# Patient Record
Sex: Female | Born: 1970
Health system: Southern US, Community
[De-identification: ages and names within clinical notes are randomized; demographics above are authoritative.]

## PROBLEM LIST (undated history)

## (undated) DIAGNOSIS — D649 Anemia, unspecified: Secondary | ICD-10-CM

## (undated) DIAGNOSIS — R112 Nausea with vomiting, unspecified: Secondary | ICD-10-CM

## (undated) DIAGNOSIS — K219 Gastro-esophageal reflux disease without esophagitis: Secondary | ICD-10-CM

## (undated) DIAGNOSIS — I10 Essential (primary) hypertension: Secondary | ICD-10-CM

## (undated) DIAGNOSIS — R51 Headache: Secondary | ICD-10-CM

## (undated) DIAGNOSIS — Z9889 Other specified postprocedural states: Secondary | ICD-10-CM

## (undated) DIAGNOSIS — E119 Type 2 diabetes mellitus without complications: Secondary | ICD-10-CM

## (undated) HISTORY — PX: TUBAL LIGATION: SHX77

## (undated) HISTORY — DX: Type 2 diabetes mellitus without complications: E11.9

## (undated) HISTORY — PX: WISDOM TOOTH EXTRACTION: SHX21

---

## 1998-05-16 ENCOUNTER — Inpatient Hospital Stay (HOSPITAL_COMMUNITY): Admission: AD | Admit: 1998-05-16 | Discharge: 1998-05-16 | Payer: Self-pay | Admitting: Obstetrics and Gynecology

## 1998-05-17 ENCOUNTER — Inpatient Hospital Stay (HOSPITAL_COMMUNITY): Admission: AD | Admit: 1998-05-17 | Discharge: 1998-05-20 | Payer: Self-pay | Admitting: Obstetrics and Gynecology

## 1998-06-29 ENCOUNTER — Other Ambulatory Visit: Admission: RE | Admit: 1998-06-29 | Discharge: 1998-06-29 | Payer: Self-pay | Admitting: *Deleted

## 2000-03-05 ENCOUNTER — Other Ambulatory Visit: Admission: RE | Admit: 2000-03-05 | Discharge: 2000-03-05 | Payer: Self-pay | Admitting: *Deleted

## 2000-10-29 HISTORY — PX: OTHER SURGICAL HISTORY: SHX169

## 2000-12-12 ENCOUNTER — Other Ambulatory Visit: Admission: RE | Admit: 2000-12-12 | Discharge: 2000-12-12 | Payer: Self-pay | Admitting: Obstetrics and Gynecology

## 2000-12-12 ENCOUNTER — Other Ambulatory Visit: Admission: RE | Admit: 2000-12-12 | Discharge: 2000-12-12 | Payer: Self-pay

## 2001-06-12 ENCOUNTER — Inpatient Hospital Stay (HOSPITAL_COMMUNITY): Admission: AD | Admit: 2001-06-12 | Discharge: 2001-06-12 | Payer: Self-pay | Admitting: Obstetrics and Gynecology

## 2001-06-14 ENCOUNTER — Inpatient Hospital Stay (HOSPITAL_COMMUNITY): Admission: AD | Admit: 2001-06-14 | Discharge: 2001-06-14 | Payer: Self-pay | Admitting: Obstetrics and Gynecology

## 2001-06-25 ENCOUNTER — Inpatient Hospital Stay (HOSPITAL_COMMUNITY): Admission: AD | Admit: 2001-06-25 | Discharge: 2001-06-28 | Payer: Self-pay | Admitting: Family Medicine

## 2001-06-30 ENCOUNTER — Encounter: Admission: RE | Admit: 2001-06-30 | Discharge: 2001-07-30 | Payer: Self-pay | Admitting: Obstetrics and Gynecology

## 2002-01-07 ENCOUNTER — Other Ambulatory Visit: Admission: RE | Admit: 2002-01-07 | Discharge: 2002-01-07 | Payer: Self-pay | Admitting: Obstetrics and Gynecology

## 2002-01-23 ENCOUNTER — Encounter (HOSPITAL_BASED_OUTPATIENT_CLINIC_OR_DEPARTMENT_OTHER): Payer: Self-pay | Admitting: General Surgery

## 2002-01-27 ENCOUNTER — Ambulatory Visit (HOSPITAL_COMMUNITY): Admission: RE | Admit: 2002-01-27 | Discharge: 2002-01-27 | Payer: Self-pay | Admitting: General Surgery

## 2003-02-23 ENCOUNTER — Other Ambulatory Visit: Admission: RE | Admit: 2003-02-23 | Discharge: 2003-02-23 | Payer: Self-pay | Admitting: Obstetrics and Gynecology

## 2004-02-15 ENCOUNTER — Other Ambulatory Visit: Admission: RE | Admit: 2004-02-15 | Discharge: 2004-02-15 | Payer: Self-pay | Admitting: Obstetrics and Gynecology

## 2004-11-09 ENCOUNTER — Encounter: Admission: RE | Admit: 2004-11-09 | Discharge: 2004-11-09 | Payer: Self-pay | Admitting: Family Medicine

## 2004-11-30 ENCOUNTER — Other Ambulatory Visit: Admission: RE | Admit: 2004-11-30 | Discharge: 2004-11-30 | Payer: Self-pay | Admitting: Obstetrics and Gynecology

## 2005-05-30 ENCOUNTER — Other Ambulatory Visit: Admission: RE | Admit: 2005-05-30 | Discharge: 2005-05-30 | Payer: Self-pay | Admitting: Obstetrics and Gynecology

## 2006-03-07 ENCOUNTER — Inpatient Hospital Stay (HOSPITAL_COMMUNITY): Admission: AD | Admit: 2006-03-07 | Discharge: 2006-03-07 | Payer: Self-pay | Admitting: Obstetrics and Gynecology

## 2006-03-23 ENCOUNTER — Inpatient Hospital Stay (HOSPITAL_COMMUNITY): Admission: AD | Admit: 2006-03-23 | Discharge: 2006-03-25 | Payer: Self-pay | Admitting: Obstetrics and Gynecology

## 2006-07-29 ENCOUNTER — Other Ambulatory Visit: Admission: RE | Admit: 2006-07-29 | Discharge: 2006-07-29 | Payer: Self-pay | Admitting: Obstetrics and Gynecology

## 2012-07-30 ENCOUNTER — Other Ambulatory Visit: Payer: Self-pay | Admitting: Family Medicine

## 2012-07-30 DIAGNOSIS — Z1231 Encounter for screening mammogram for malignant neoplasm of breast: Secondary | ICD-10-CM

## 2012-07-31 ENCOUNTER — Ambulatory Visit
Admission: RE | Admit: 2012-07-31 | Discharge: 2012-07-31 | Disposition: A | Discharge status: Home / Self Care | Payer: BC Managed Care – PPO | Source: Ambulatory Visit | Attending: Family Medicine | Admitting: Family Medicine

## 2012-07-31 DIAGNOSIS — Z1231 Encounter for screening mammogram for malignant neoplasm of breast: Secondary | ICD-10-CM

## 2013-08-25 ENCOUNTER — Other Ambulatory Visit: Payer: Self-pay | Admitting: Obstetrics and Gynecology

## 2013-09-01 ENCOUNTER — Other Ambulatory Visit: Payer: Self-pay | Admitting: Obstetrics and Gynecology

## 2013-09-07 ENCOUNTER — Encounter (HOSPITAL_COMMUNITY): Payer: Self-pay | Admitting: Pharmacist

## 2013-09-14 ENCOUNTER — Encounter (HOSPITAL_COMMUNITY): Payer: Self-pay

## 2013-09-14 ENCOUNTER — Encounter (HOSPITAL_COMMUNITY)
Admission: RE | Admit: 2013-09-14 | Discharge: 2013-09-14 | Disposition: A | Discharge status: Home / Self Care | Payer: BC Managed Care – PPO | Source: Ambulatory Visit | Attending: Obstetrics and Gynecology | Admitting: Obstetrics and Gynecology

## 2013-09-14 ENCOUNTER — Other Ambulatory Visit: Payer: Self-pay

## 2013-09-14 ENCOUNTER — Encounter (HOSPITAL_COMMUNITY): Payer: Self-pay | Admitting: Obstetrics and Gynecology

## 2013-09-14 HISTORY — DX: Gastro-esophageal reflux disease without esophagitis: K21.9

## 2013-09-14 HISTORY — DX: Nausea with vomiting, unspecified: R11.2

## 2013-09-14 HISTORY — DX: Other specified postprocedural states: Z98.890

## 2013-09-14 HISTORY — DX: Headache: R51

## 2013-09-14 HISTORY — DX: Anemia, unspecified: D64.9

## 2013-09-14 HISTORY — DX: Essential (primary) hypertension: I10

## 2013-09-14 LAB — BASIC METABOLIC PANEL
BUN: 9 mg/dL (ref 6–23)
Chloride: 101 mEq/L (ref 96–112)
GFR calc Af Amer: >90 mL/min (ref >90)
Glucose, Bld: 90 mg/dL (ref 70–99)
Potassium: 4 mEq/L (ref 3.5–5.1)
Sodium: 137 mEq/L (ref 135–145)

## 2013-09-14 LAB — CBC
HCT: 35.6 % — ABNORMAL LOW (ref 36.0–46.0)
Hemoglobin: 11.5 g/dL — ABNORMAL LOW (ref 12.0–15.0)
MCH: 27.5 pg (ref 26.0–34.0)
MCHC: 32.3 g/dL (ref 30.0–36.0)

## 2013-09-14 NOTE — Pre-Procedure Instructions (Signed)
Dr Rodman Pickle reviewed patient's EKG/history.  Ok for surgery.

## 2013-09-14 NOTE — Patient Instructions (Addendum)
   Your procedure is scheduled on:  Tuesday, Nov 18th  Enter through the Main Entrance of [facility] at: 830 AM Pick up the phone at the desk and dial [phone] and inform us of your arrival.  Please call this number if you have any problems the morning of surgery: [phone]  Remember: Do not eat or drink after midnight: Monday Take these medicines the morning of surgery with a SIP OF WATER:  Benazepril, prilosec   Do not wear jewelry, make-up, or FINGER nail polish No metal in your hair or on your body. Do not wear lotions, powders, perfumes. You may wear deodorant.  Please use your CHG wash as directed prior to surgery.  Do not shave anywhere for at least 12 hours prior to first CHG shower.  Do not bring valuables to the hospital. Contacts, dentures or bridgework may not be worn into surgery.  Leave suitcase in the car. After Surgery it may be brought to your room. For patients being admitted to the hospital, checkout time is 11:00am the day of discharge.  Home with husband Charmian Muff  cell [phone].

## 2013-09-14 NOTE — H&P (Addendum)
  Admission History and Physical Exam for a Gynecology Patient  Natalie Farmer is a 42 y.o. female, G3P3003, who presents for a vaginal hysterectomy.  The patient has a long history of menorrhagia.  She has had a NovaSure ablation of the endometrium.  Her cycles continue to be very heavy.  She wants to proceed with definitive therapy.  Hormonal therapy has not relieved her discomfort. She has been followed at the [facility] and Gynecology division of [facility].  OB History   Grav Para Term Preterm Abortions TAB SAB Ect Mult Living   3 3 3       3       Past Medical History  Diagnosis Date  . SVD (spontaneous vaginal delivery)     x 3  . PONV (postoperative nausea and vomiting)   . Headache(784.0)   . GERD (gastroesophageal reflux disease)   . Hypertension   . Anemia     No prescriptions prior to admission    Past Surgical History  Procedure Laterality Date  . Excess tissue removed  2002    from under both arms  . Wisdom tooth extraction    . Tubal ligation      Allergies  Allergen Reactions  . Shellfish Allergy Anaphylaxis    Family History: family history is not on file.  Social History:  reports that she has never smoked. She has never used smokeless tobacco. She reports that she does not drink alcohol or use illicit drugs.  Review of systems: See HPI.  Admission Physical Exam:    There is no height or weight on file to calculate BMI.  There were no vitals taken for this visit.  HEENT:                 Within normal limits Chest:                   Clear Heart:                    Regular rate and rhythm Breasts:                No masses, skin changes, bleeding, or discharge present Abdomen:             Nontender, no masses Extremities:          Grossly normal Neurologic exam: Grossly normal  Pelvic exam:  External genitalia: normal general appearance Vaginal: normal without tenderness, induration or masses Cervix:  normal appearance Adnexa: normal bimanual exam Uterus: enlarged Rectal: no masses  Assessment:  Menorrhagia  Unsuccessful endometrial ablation  Hypertension  Obesity (BMI = 32.1)  Anemia  Headache  Plan:  We reviewed her management options.  The risk and benefits were discussed.  The patient has elected to proceed with hysterectomy at this time.  The vaginal route is thought to be most appropriate.  She understands the indications for her surgical procedure.  She accepts the risk of, but not limited to, anesthetic complications, bleeding, infections, and possible damage to surrounding organs.   Janine Limbo 09/14/2013

## 2013-09-15 ENCOUNTER — Encounter (HOSPITAL_COMMUNITY)
Admission: RE | Disposition: A | Discharge status: Home / Self Care | Payer: Self-pay | Source: Ambulatory Visit | Attending: Obstetrics and Gynecology

## 2013-09-15 ENCOUNTER — Encounter (HOSPITAL_COMMUNITY): Payer: Self-pay | Admitting: Anesthesiology

## 2013-09-15 ENCOUNTER — Ambulatory Visit (HOSPITAL_COMMUNITY): Payer: BC Managed Care – PPO | Admitting: Anesthesiology

## 2013-09-15 ENCOUNTER — Encounter (HOSPITAL_COMMUNITY): Payer: BC Managed Care – PPO | Admitting: Anesthesiology

## 2013-09-15 ENCOUNTER — Observation Stay (HOSPITAL_COMMUNITY)
Admission: RE | Admit: 2013-09-15 | Discharge: 2013-09-16 | Disposition: A | Discharge status: Home / Self Care | Payer: BC Managed Care – PPO | Source: Ambulatory Visit | Attending: Obstetrics and Gynecology | Admitting: Obstetrics and Gynecology

## 2013-09-15 DIAGNOSIS — Z6832 Body mass index (BMI) 32.0-32.9, adult: Secondary | ICD-10-CM | POA: Insufficient documentation

## 2013-09-15 DIAGNOSIS — N841 Polyp of cervix uteri: Secondary | ICD-10-CM | POA: Insufficient documentation

## 2013-09-15 DIAGNOSIS — I10 Essential (primary) hypertension: Secondary | ICD-10-CM | POA: Insufficient documentation

## 2013-09-15 DIAGNOSIS — D649 Anemia, unspecified: Secondary | ICD-10-CM | POA: Insufficient documentation

## 2013-09-15 DIAGNOSIS — R51 Headache: Secondary | ICD-10-CM | POA: Insufficient documentation

## 2013-09-15 DIAGNOSIS — E669 Obesity, unspecified: Secondary | ICD-10-CM | POA: Insufficient documentation

## 2013-09-15 DIAGNOSIS — N920 Excessive and frequent menstruation with regular cycle: Principal | ICD-10-CM | POA: Insufficient documentation

## 2013-09-15 DIAGNOSIS — N946 Dysmenorrhea, unspecified: Secondary | ICD-10-CM | POA: Insufficient documentation

## 2013-09-15 HISTORY — PX: VAGINAL HYSTERECTOMY: SHX2639

## 2013-09-15 HISTORY — PX: BILATERAL SALPINGECTOMY: SHX5743

## 2013-09-15 SURGERY — HYSTERECTOMY, VAGINAL
Anesthesia: General | Site: Vagina | Wound class: Clean Contaminated

## 2013-09-15 MED ORDER — FLUMAZENIL 0.5 MG/5ML IV SOLN
INTRAVENOUS | Status: AC
  Filled 2013-09-15: qty 5

## 2013-09-15 MED ORDER — VASOPRESSIN 20 UNIT/ML IJ SOLN
INTRAMUSCULAR | Status: AC
  Filled 2013-09-15: qty 1

## 2013-09-15 MED ORDER — GLYCOPYRROLATE 0.2 MG/ML IJ SOLN
INTRAMUSCULAR | Status: AC
  Filled 2013-09-15: qty 1

## 2013-09-15 MED ORDER — SODIUM CHLORIDE 0.9 % IV SOLN
INTRAVENOUS | Status: DC | PRN
  Administered 2013-09-15: 10:00:00 via INTRAMUSCULAR

## 2013-09-15 MED ORDER — HYDROMORPHONE HCL PF 1 MG/ML IJ SOLN
INTRAMUSCULAR | Status: AC
  Administered 2013-09-15: 0.5 mg via INTRAVENOUS
  Filled 2013-09-15: qty 1

## 2013-09-15 MED ORDER — HYDROMORPHONE HCL PF 1 MG/ML IJ SOLN
0.2500 mg | INTRAMUSCULAR | Status: DC | PRN
  Administered 2013-09-15 (×4): 0.5 mg via INTRAVENOUS

## 2013-09-15 MED ORDER — CYCLOBENZAPRINE HCL 10 MG PO TABS
10.0000 mg | ORAL_TABLET | Freq: Every day | ORAL | Status: DC | PRN
  Filled 2013-09-15: qty 1

## 2013-09-15 MED ORDER — FENTANYL CITRATE 0.05 MG/ML IJ SOLN
INTRAMUSCULAR | Status: AC
  Filled 2013-09-15: qty 2

## 2013-09-15 MED ORDER — LIDOCAINE HCL (CARDIAC) 20 MG/ML IV SOLN
INTRAVENOUS | Status: DC | PRN
  Administered 2013-09-15: 30 mg via INTRAVENOUS

## 2013-09-15 MED ORDER — MENTHOL 3 MG MT LOZG: 1.0000 | LOZENGE | OROMUCOSAL | Status: DC | PRN

## 2013-09-15 MED ORDER — SCOPOLAMINE 1 MG/3DAYS TD PT72: 1.0000 | MEDICATED_PATCH | TRANSDERMAL | Status: DC

## 2013-09-15 MED ORDER — DEXAMETHASONE SODIUM PHOSPHATE 10 MG/ML IJ SOLN
INTRAMUSCULAR | Status: DC | PRN
  Administered 2013-09-15: 10 mg via INTRAVENOUS

## 2013-09-15 MED ORDER — DEXAMETHASONE SODIUM PHOSPHATE 10 MG/ML IJ SOLN
INTRAMUSCULAR | Status: AC
  Filled 2013-09-15: qty 1

## 2013-09-15 MED ORDER — DIPHENHYDRAMINE HCL 50 MG/ML IJ SOLN: 12.5000 mg | Freq: Four times a day (QID) | INTRAMUSCULAR | Status: DC | PRN

## 2013-09-15 MED ORDER — DIPHENHYDRAMINE HCL 12.5 MG/5ML PO ELIX: 12.5000 mg | ORAL_SOLUTION | Freq: Four times a day (QID) | ORAL | Status: DC | PRN

## 2013-09-15 MED ORDER — KETOROLAC TROMETHAMINE 30 MG/ML IJ SOLN
INTRAMUSCULAR | Status: DC | PRN
  Administered 2013-09-15 (×2): 30 mg via INTRAVENOUS

## 2013-09-15 MED ORDER — NALOXONE HCL 0.4 MG/ML IJ SOLN: 0.4000 mg | INTRAMUSCULAR | Status: DC | PRN

## 2013-09-15 MED ORDER — ONDANSETRON HCL 4 MG/2ML IJ SOLN
INTRAMUSCULAR | Status: DC | PRN
  Administered 2013-09-15: 4 mg via INTRAVENOUS

## 2013-09-15 MED ORDER — KETOROLAC TROMETHAMINE 30 MG/ML IJ SOLN
30.0000 mg | Freq: Four times a day (QID) | INTRAMUSCULAR | Status: DC
  Administered 2013-09-15 – 2013-09-16 (×3): 30 mg via INTRAVENOUS
  Filled 2013-09-15 (×3): qty 1

## 2013-09-15 MED ORDER — LACTATED RINGERS IV SOLN
INTRAVENOUS | Status: DC
  Administered 2013-09-15 – 2013-09-16 (×3): via INTRAVENOUS

## 2013-09-15 MED ORDER — SCOPOLAMINE 1 MG/3DAYS TD PT72
MEDICATED_PATCH | TRANSDERMAL | Status: AC
  Filled 2013-09-15: qty 1

## 2013-09-15 MED ORDER — NEOSTIGMINE METHYLSULFATE 1 MG/ML IJ SOLN
INTRAMUSCULAR | Status: AC
  Filled 2013-09-15: qty 1

## 2013-09-15 MED ORDER — ROCURONIUM BROMIDE 100 MG/10ML IV SOLN
INTRAVENOUS | Status: AC
  Filled 2013-09-15: qty 1

## 2013-09-15 MED ORDER — TOPIRAMATE 100 MG PO TABS
100.0000 mg | ORAL_TABLET | Freq: Every day | ORAL | Status: DC
  Administered 2013-09-15: 100 mg via ORAL
  Filled 2013-09-15: qty 1

## 2013-09-15 MED ORDER — KETOROLAC TROMETHAMINE 30 MG/ML IJ SOLN
INTRAMUSCULAR | Status: AC
  Filled 2013-09-15: qty 2

## 2013-09-15 MED ORDER — PROPOFOL 10 MG/ML IV EMUL
INTRAVENOUS | Status: AC
  Filled 2013-09-15: qty 20

## 2013-09-15 MED ORDER — BENAZEPRIL HCL 5 MG PO TABS
5.0000 mg | ORAL_TABLET | Freq: Every day | ORAL | Status: DC
  Filled 2013-09-15: qty 1

## 2013-09-15 MED ORDER — LIDOCAINE HCL (CARDIAC) 20 MG/ML IV SOLN
INTRAVENOUS | Status: AC
  Filled 2013-09-15: qty 5

## 2013-09-15 MED ORDER — SODIUM CHLORIDE 0.9 % IJ SOLN: 9.0000 mL | INTRAMUSCULAR | Status: DC | PRN

## 2013-09-15 MED ORDER — SODIUM CHLORIDE 0.9 % IJ SOLN
INTRAMUSCULAR | Status: AC
  Filled 2013-09-15: qty 100

## 2013-09-15 MED ORDER — NEOSTIGMINE METHYLSULFATE 1 MG/ML IJ SOLN
INTRAMUSCULAR | Status: DC | PRN
  Administered 2013-09-15 (×2): 2 mg via INTRAVENOUS

## 2013-09-15 MED ORDER — IBUPROFEN 600 MG PO TABS: 600.0000 mg | ORAL_TABLET | Freq: Four times a day (QID) | ORAL | Status: DC | PRN

## 2013-09-15 MED ORDER — GLYCOPYRROLATE 0.2 MG/ML IJ SOLN
INTRAMUSCULAR | Status: AC
  Filled 2013-09-15: qty 2

## 2013-09-15 MED ORDER — GLYCOPYRROLATE 0.2 MG/ML IJ SOLN
INTRAMUSCULAR | Status: DC | PRN
  Administered 2013-09-15: 0.3 mg via INTRAVENOUS
  Administered 2013-09-15 (×2): .15 mg via INTRAVENOUS

## 2013-09-15 MED ORDER — ROCURONIUM BROMIDE 100 MG/10ML IV SOLN
INTRAVENOUS | Status: DC | PRN
  Administered 2013-09-15: 35 mg via INTRAVENOUS

## 2013-09-15 MED ORDER — HYDROMORPHONE 0.3 MG/ML IV SOLN
INTRAVENOUS | Status: DC
  Administered 2013-09-15: 17 mL via INTRAVENOUS
  Administered 2013-09-15: 6.6 mg via INTRAVENOUS
  Administered 2013-09-15: 21:00:00 via INTRAVENOUS
  Administered 2013-09-15: 0.3 mL via INTRAVENOUS
  Administered 2013-09-16 (×2): 0.6 mg via INTRAVENOUS
  Filled 2013-09-15 (×2): qty 25

## 2013-09-15 MED ORDER — MIDAZOLAM HCL 2 MG/2ML IJ SOLN
INTRAMUSCULAR | Status: DC | PRN
  Administered 2013-09-15: 2 mg via INTRAVENOUS

## 2013-09-15 MED ORDER — ONDANSETRON HCL 4 MG/2ML IJ SOLN: 4.0000 mg | Freq: Four times a day (QID) | INTRAMUSCULAR | Status: DC | PRN

## 2013-09-15 MED ORDER — MIDAZOLAM HCL 2 MG/2ML IJ SOLN
INTRAMUSCULAR | Status: AC
  Filled 2013-09-15: qty 2

## 2013-09-15 MED ORDER — FENTANYL CITRATE 0.05 MG/ML IJ SOLN
INTRAMUSCULAR | Status: AC
  Filled 2013-09-15: qty 5

## 2013-09-15 MED ORDER — LACTATED RINGERS IV SOLN
INTRAVENOUS | Status: DC
  Administered 2013-09-15 (×3): via INTRAVENOUS

## 2013-09-15 MED ORDER — ONDANSETRON HCL 4 MG/2ML IJ SOLN
INTRAMUSCULAR | Status: AC
  Filled 2013-09-15: qty 2

## 2013-09-15 MED ORDER — OXYCODONE-ACETAMINOPHEN 5-325 MG PO TABS: 1.0000 | ORAL_TABLET | ORAL | Status: DC | PRN

## 2013-09-15 MED ORDER — CEFAZOLIN SODIUM-DEXTROSE 2-3 GM-% IV SOLR
2.0000 g | INTRAVENOUS | Status: AC
  Administered 2013-09-15: 2 g via INTRAVENOUS

## 2013-09-15 MED ORDER — PROPOFOL 10 MG/ML IV BOLUS
INTRAVENOUS | Status: DC | PRN
  Administered 2013-09-15: 180 mg via INTRAVENOUS

## 2013-09-15 MED ORDER — FENTANYL CITRATE 0.05 MG/ML IJ SOLN
INTRAMUSCULAR | Status: DC | PRN
  Administered 2013-09-15 (×6): 50 ug via INTRAVENOUS

## 2013-09-15 MED ORDER — PROMETHAZINE HCL 25 MG PO TABS
12.5000 mg | ORAL_TABLET | Freq: Four times a day (QID) | ORAL | Status: DC | PRN
Start: 2013-09-15 — End: 2013-09-16

## 2013-09-15 MED ORDER — PANTOPRAZOLE SODIUM 40 MG PO TBEC
40.0000 mg | DELAYED_RELEASE_TABLET | Freq: Every day | ORAL | Status: DC
  Filled 2013-09-15 (×2): qty 1

## 2013-09-15 MED ORDER — KETOROLAC TROMETHAMINE 30 MG/ML IJ SOLN
INTRAMUSCULAR | Status: AC
  Filled 2013-09-15: qty 1

## 2013-09-15 MED ORDER — FLUMAZENIL 0.5 MG/5ML IV SOLN
INTRAVENOUS | Status: DC | PRN
  Administered 2013-09-15: .75 mg via INTRAVENOUS

## 2013-09-15 SURGICAL SUPPLY — 24 items
CANISTER SUCT 3000ML (MISCELLANEOUS) ×2 IMPLANT
CLOTH BEACON ORANGE TIMEOUT ST (SAFETY) ×2 IMPLANT
CONT PATH 16OZ SNAP LID 3702 (MISCELLANEOUS) IMPLANT
DECANTER SPIKE VIAL GLASS SM (MISCELLANEOUS) ×1 IMPLANT
DRAPE PROXIMA HALF (DRAPES) ×2 IMPLANT
GLOVE BIOGEL PI IND STRL 6.5 (GLOVE) ×1 IMPLANT
GLOVE BIOGEL PI IND STRL 8.5 (GLOVE) ×1 IMPLANT
GLOVE BIOGEL PI INDICATOR 6.5 (GLOVE) ×1
GLOVE BIOGEL PI INDICATOR 8.5 (GLOVE) ×1
GLOVE ECLIPSE 8.0 STRL XLNG CF (GLOVE) ×4 IMPLANT
GOWN STRL REIN XL XLG (GOWN DISPOSABLE) ×8 IMPLANT
NEEDLE MAYO .5 CIRCLE (NEEDLE) ×2 IMPLANT
NS IRRIG 1000ML POUR BTL (IV SOLUTION) ×2 IMPLANT
PACK VAGINAL WOMENS (CUSTOM PROCEDURE TRAY) ×2 IMPLANT
PAD OB MATERNITY 4.3X12.25 (PERSONAL CARE ITEMS) ×2 IMPLANT
SUT VIC AB 0 CT1 18XCR BRD8 (SUTURE) ×3 IMPLANT
SUT VIC AB 0 CT1 27 (SUTURE) ×2
SUT VIC AB 0 CT1 27XBRD ANBCTR (SUTURE) ×1 IMPLANT
SUT VIC AB 0 CT1 8-18 (SUTURE) ×6
SUT VICRYL 0 TIES 12 18 (SUTURE) ×2 IMPLANT
SYR TB 1ML 25GX5/8 (SYRINGE) ×2 IMPLANT
TOWEL OR 17X24 6PK STRL BLUE (TOWEL DISPOSABLE) ×4 IMPLANT
TRAY FOLEY CATH 14FR (SET/KITS/TRAYS/PACK) ×2 IMPLANT
WATER STERILE IRR 1000ML POUR (IV SOLUTION) ×2 IMPLANT

## 2013-09-15 NOTE — Anesthesia Preprocedure Evaluation (Signed)
Anesthesia Evaluation  Patient identified by MRN, date of birth, ID band Patient awake    Reviewed: Allergy & Precautions, H&P , Patient's Chart, lab work & pertinent test results, reviewed documented beta blocker date and time   Airway Mallampati: II TM Distance: >3 FB Neck ROM: full    Dental no notable dental hx.    Pulmonary  breath sounds clear to auscultation  Pulmonary exam normal       Cardiovascular hypertension, On Medications Rhythm:regular Rate:Normal     Neuro/Psych    GI/Hepatic GERD-  Medicated,  Endo/Other    Renal/GU      Musculoskeletal   Abdominal   Peds  Hematology   Anesthesia Other Findings   Reproductive/Obstetrics                           Anesthesia Physical Anesthesia Plan  ASA: II  Anesthesia Plan: General   Post-op Pain Management:    Induction: Intravenous  Airway Management Planned: Oral ETT  Additional Equipment:   Intra-op Plan:   Post-operative Plan: Extubation in OR  Informed Consent: I have reviewed the patients History and Physical, chart, labs and discussed the procedure including the risks, benefits and alternatives for the proposed anesthesia with the patient or authorized representative who has indicated his/her understanding and acceptance.   Dental Advisory Given and Dental advisory given  Plan Discussed with: CRNA and Surgeon  Anesthesia Plan Comments: (  Discussed general anesthesia, including possible nausea, instrumentation of airway, sore throat,pulmonary aspiration, etc. I asked if the were any outstanding questions, or  concerns before we proceeded. )        Anesthesia Quick Evaluation

## 2013-09-15 NOTE — Transfer of Care (Signed)
Immediate Anesthesia Transfer of Care Note  Patient: Natalie Farmer  Procedure(s) Performed: Procedure(s): HYSTERECTOMY VAGINAL (N/A) BILATERAL SALPINGECTOMY (N/A)  Patient Location: PACU  Anesthesia Type:General  Level of Consciousness: awake, oriented, sedated and patient cooperative  Airway & Oxygen Therapy: Patient Spontanous Breathing and Patient connected to nasal cannula oxygen  Post-op Assessment: Report given to PACU RN and Post -op Vital signs reviewed and stable  Post vital signs: Reviewed and stable  Complications: No apparent anesthesia complications

## 2013-09-15 NOTE — Progress Notes (Signed)
Subjective: Patient reports tolerating PO.    Objective: I have reviewed patient's vital signs.  General: no distress GI: soft, non-tender; bowel sounds normal; no masses,  no organomegaly Extremities: extremities normal, atraumatic, no cyanosis or edema   Assessment/Plan: Doing well S/P Vag Hys Home in the morning.   LOS: 0 days    Janine Limbo 09/15/2013, 3:48 PM

## 2013-09-15 NOTE — H&P (Signed)
The patient was interviewed and examined today.  The previously documented history and physical examination was reviewed. There are no changes. The operative procedure was reviewed. The risks and benefits were outlined again. The specific risks include, but are not limited to, anesthetic complications, bleeding, infections, and possible damage to the surrounding organs. The patient's questions were answered.  We are ready to proceed as outlined. The likelihood of the patient achieving the goals of this procedure is very likely.   BP 143/92  Pulse 68  Temp(Src) 97.9 F (36.6 C) (Oral)  Resp 20  SpO2 100%  CBC    Component Value Date/Time   WBC 8.2 09/14/2013 1030   RBC 4.18 09/14/2013 1030   HGB 11.5* 09/14/2013 1030   HCT 35.6* 09/14/2013 1030   PLT 301 09/14/2013 1030   MCV 85.2 09/14/2013 1030   MCH 27.5 09/14/2013 1030   MCHC 32.3 09/14/2013 1030   RDW 13.5 09/14/2013 1030    Leonard Schwartz, M.D.

## 2013-09-15 NOTE — Op Note (Signed)
OPERATIVE NOTE  Natalie Farmer  DOB:    05/22/1971  MRN:    [id number]  CSN:    [id number]  Date of Surgery:  09/15/2013  Preoperative Diagnosis:  Menorrhagia  Anemia  Hypertension  Obesity  Postoperative Diagnosis:  Same  Procedure:  Vaginal hysterectomy Bilateral salpingectomy  Surgeon:  Leonard Schwartz, M.D.  Assistant:  Henreitta Leber, PA-C  Anesthetic:  General  Disposition:  The patient presents with the above-mentioned diagnosis. She understands the indications for surgical procedure.  She also understands the alternative treatment options. She accepts the risk of, but not limited to, anesthetic complications, bleeding, infections, and possible damage to the surrounding organs.  Findings:  The uterus was normal size. The adnexa were normal except for defects from the patient's prior tubal sterilization procedure.  Procedure:  The patient was taken to the operating room where a general anesthetic was given. The patient's lower abdomen, perineum, and vagina were prepped with multiple layers of Betadine. A Foley catheter was placed in the bladder. The patient was sterilely draped. An examination under anesthesia was performed. Operative findings are mentioned above. The cervix was injected with Pitressin and saline. A circumferential incision was made around the cervix. The vaginal mucosa was advanced anteriorly and posteriorly. The anterior cul-de-sac and in the posterior cul-de-sac were sharply entered. Alternating from right to left the uterosacral ligaments, paracervical tissues, parametrial tissues, and uterine arteries were clamped, cut, sutured, and tied securely. The upper pedicles were then clamped and cut. The uterus was removed from the operative field. The upper pedicles were secured using free ties and then suture ligatures. The right mesosalpinx was clamped and cut. The right fallopian tube was removed. A free tie was placed to obtain  hemostasis. The left mesosalpinx was then clamped and cut. The left fallopian tube was removed. A suture ligature was placed for hemostasis. Hemostasis was confirmed. The sutures attached to the uterosacral ligaments were brought out through the vaginal angles and then tied securely. A McCall culdoplasty suture was placed in the posterior cul-de-sac incorporating the uterosacral ligaments bilaterally and the posterior peritoneum. A final check was made for hemostasis and again hemostasis was confirmed. The vaginal cuff was closed using figure-of-eight sutures incorporating the anterior vaginal mucosa, the anterior peritoneum, posterior peritoneum, and the posterior vaginal mucosa. The McCall culdoplasty suture was tied securely and the apex of the vagina was noted to elevate into the midpelvis. The patient tolerated her procedure well. She was awakened from her anesthetic without difficulty and then transported to the recovery room in stable condition. Sponge, needle, and instrument counts were correct on 2 occasions. The estimated blood loss was 50 cc's. 0 Vicryl is the suture material used throughout the procedure. The uterus and fallopian tubes were sent to pathology.   Leonard Schwartz, M.D.

## 2013-09-15 NOTE — Anesthesia Postprocedure Evaluation (Signed)
  Anesthesia Post-op Note  Patient: Natalie Farmer  Procedure(s) Performed: Procedure(s): HYSTERECTOMY VAGINAL (N/A) BILATERAL SALPINGECTOMY (N/A)  Patient Location: Womens Unit  Anesthesia Type:General  Level of Consciousness: awake, alert  and oriented  Airway and Oxygen Therapy: Patient Spontanous Breathing and Patient connected to nasal cannula oxygen  Post-op Pain: mild  Post-op Assessment: Post-op Vital signs reviewed and Patient's Cardiovascular Status Stable  Post-op Vital Signs: Reviewed and stable  Complications: No apparent anesthesia complications

## 2013-09-16 ENCOUNTER — Encounter (HOSPITAL_COMMUNITY): Payer: Self-pay | Admitting: Obstetrics and Gynecology

## 2013-09-16 LAB — CBC
Hemoglobin: 10.7 g/dL — ABNORMAL LOW (ref 12.0–15.0)
MCV: 84.9 fL (ref 78.0–100.0)
Platelets: 310 10*3/uL (ref 150–400)
RBC: 3.84 MIL/uL — ABNORMAL LOW (ref 3.87–5.11)

## 2013-09-16 MED ORDER — IBUPROFEN 600 MG PO TABS: 600.0000 mg | ORAL_TABLET | Freq: Four times a day (QID) | ORAL | Status: DC | PRN

## 2013-09-16 MED ORDER — DOCUSATE SODIUM 100 MG PO CAPS: 100.0000 mg | ORAL_CAPSULE | Freq: Two times a day (BID) | ORAL | Status: DC

## 2013-09-16 MED ORDER — FERROUS SULFATE 325 (65 FE) MG PO TABS: 325.0000 mg | ORAL_TABLET | Freq: Two times a day (BID) | ORAL | Status: AC

## 2013-09-16 MED ORDER — PROMETHAZINE HCL 12.5 MG PO TABS: 12.5000 mg | ORAL_TABLET | Freq: Four times a day (QID) | ORAL | Status: DC | PRN

## 2013-09-16 MED ORDER — OXYCODONE-ACETAMINOPHEN 5-325 MG PO TABS: 1.0000 | ORAL_TABLET | ORAL | Status: DC | PRN

## 2013-09-16 NOTE — Discharge Summary (Signed)
Physician Discharge Summary  Patient ID: Natalie Farmer MRN: [id number] DOB/AGE: [birth year] 42 y.o.  Admit date: 09/15/2013 Discharge date: 09/16/2013   Discharge Diagnoses:  Menorrhagia, Dysmenorrhea and Anemia  Operation: Total Vaginal Hysterectomy  Discharged Condition: Good  Hospital Course: On the date of admission the patient underwent the aforementioned procedures and tolerated them well.  Post operative course was unremarkable with the patient tolerating a post operative hemoglobin of 10.7 (pre-operative hemoglobin was 11.5).  By post operative day #1 the patient had resumed bowel and bladder function and was therefore deemed ready for discharge  home.  Disposition: Home to self care  Discharge Medications:    Medication List         benazepril 5 MG tablet  Commonly known as:  LOTENSIN  Take 5 mg by mouth daily.     cyclobenzaprine 10 MG tablet  Commonly known as:  FLEXERIL  Take 10 mg by mouth daily as needed (migraines).     ibuprofen 600 MG tablet  Commonly known as:  ADVIL,MOTRIN  Take 1 tablet (600 mg total) by mouth every 6 (six) hours as needed (mild pain).     omeprazole 20 MG capsule  Commonly known as:  PRILOSEC  Take 20 mg by mouth daily.     oxyCODONE-acetaminophen 5-325 MG per tablet  Commonly known as:  PERCOCET/ROXICET  Take 1-2 tablets by mouth every 4 (four) hours as needed for severe pain (moderate to severe pain (when tolerating fluids)).     promethazine 12.5 MG tablet  Commonly known as:  PHENERGAN  Take 1 tablet (12.5 mg total) by mouth every 6 (six) hours as needed for nausea or vomiting.     topiramate 100 MG tablet  Commonly known as:  TOPAMAX  Take 100 mg by mouth at bedtime.        Follow-up: Dr. Stefano Gaul, October 15, 2013 at 11:30 a.m.   SignedHenreitta Leber , PA-C  09/16/2013, 7:02 AM

## 2013-09-16 NOTE — Progress Notes (Signed)
Discharge instructions reviewed with patient and significant other.  Patient states understanding of home care, medications, activity, signs/symptoms to report to MD and return MD office visit.  No home equipment needed.  Patient ambulated for discharge in stable condition with staff without incident. 

## 2013-09-16 NOTE — Progress Notes (Signed)
PROGRESS NOTE  I have reviewed the patient's vital signs, labs, and notes. I have examined the patient. I agree with the previous note from the physician assistant.  Leonard Schwartz, M.D. 09/16/2013

## 2013-09-16 NOTE — Progress Notes (Signed)
Natalie Farmer is a77 y.o.  [id number]  Post Op Date #1: TVH/BS  Subjective: Patient is Doing well postoperatively. Patient has Pain is controlled with current analgesics. Medications being used: prescription NSAID's including Toradol and narcotic analgesics including PCA Dilaudid., Tolerating liquids, voiding, ambulating without dizziness or nausea and preparing to order regular breakfast.  Objective: Vital signs in last 24 hours: Temp:  [97.5 F (36.4 C)-98.5 F (36.9 C)] 97.8 F (36.6 C) (11/19 0610) Pulse Rate:  [60-89] 72 (11/19 0610) Resp:  [10-23] 15 (11/19 0610) BP: (121-159)/(77-99) 134/84 mmHg (11/19 0610) SpO2:  [94 %-100 %] 98 % (11/19 0610) Weight:  [205 lb (92.987 kg)] 205 lb (92.987 kg) (11/18 1500)  Intake/Output from previous day: 11/18 0701 - 11/19 0700 In: 5078.3 [P.O.:720; I.V.:3658.3] Out: 3050 [Urine:3000] Intake/Output this shift: Total I/O In: 1495 [P.O.:120; I.V.:1375] Out: 2375 [Urine:2375]  Recent Labs Lab 09/14/13 1030 09/16/13 0537  WBC 8.2 11.8*  HGB 11.5* 10.7*  HCT 35.6* 32.6*  PLT 301 310     Recent Labs Lab 09/14/13 1030  NA 137  K 4.0  CL 101  CO2 28  BUN 9  CREATININE 0.90  CALCIUM 9.1  GLUCOSE 90    EXAM: General: alert, cooperative and no distress Resp: clear to auscultation bilaterally Cardio: regular rate and rhythm, S1, S2 normal, no murmur, click, rub or gallop GI: Soft, bowel sounds are present Extremities: Homans sign is negative, no sign of DVT and no calf tenderness Vaginal Bleeding: scant   Assessment: s/p Procedure(s): HYSTERECTOMY VAGINAL BILATERAL SALPINGECTOMY: stable and progressing well  Plan: Advance diet Probable Discharge Home  LOS: 1 day    Vallarie Fei, PA-C 09/16/2013 6:49 AM

## 2014-08-03 ENCOUNTER — Other Ambulatory Visit: Payer: Self-pay

## 2014-08-03 DIAGNOSIS — Z1239 Encounter for other screening for malignant neoplasm of breast: Secondary | ICD-10-CM

## 2014-08-19 ENCOUNTER — Other Ambulatory Visit: Payer: Self-pay

## 2014-08-19 DIAGNOSIS — Z1231 Encounter for screening mammogram for malignant neoplasm of breast: Secondary | ICD-10-CM

## 2014-08-25 ENCOUNTER — Ambulatory Visit
Admission: RE | Admit: 2014-08-25 | Discharge: 2014-08-25 | Disposition: A | Discharge status: Home / Self Care | Payer: BC Managed Care – PPO | Source: Ambulatory Visit

## 2014-08-25 DIAGNOSIS — Z1231 Encounter for screening mammogram for malignant neoplasm of breast: Secondary | ICD-10-CM

## 2014-08-30 ENCOUNTER — Encounter (HOSPITAL_COMMUNITY): Payer: Self-pay | Admitting: Obstetrics and Gynecology

## 2015-08-14 ENCOUNTER — Other Ambulatory Visit: Payer: Self-pay | Admitting: Allergy and Immunology

## 2015-08-22 ENCOUNTER — Other Ambulatory Visit: Payer: Self-pay | Admitting: Allergy and Immunology

## 2015-10-19 ENCOUNTER — Encounter: Payer: Self-pay | Admitting: Allergy and Immunology

## 2015-10-19 ENCOUNTER — Ambulatory Visit (INDEPENDENT_AMBULATORY_CARE_PROVIDER_SITE_OTHER): Payer: BLUE CROSS/BLUE SHIELD | Admitting: Allergy and Immunology

## 2015-10-19 VITALS — BP 120/85 | HR 79 | Resp 20

## 2015-10-19 DIAGNOSIS — L501 Idiopathic urticaria: Secondary | ICD-10-CM | POA: Diagnosis not present

## 2015-10-19 DIAGNOSIS — Z91018 Allergy to other foods: Secondary | ICD-10-CM | POA: Diagnosis not present

## 2015-10-19 DIAGNOSIS — R635 Abnormal weight gain: Secondary | ICD-10-CM

## 2015-10-19 NOTE — Patient Instructions (Signed)
  1. Cetirizine 10 mg daily or loratadine 20 mg daily: EpiPen if needed  2. Blood- a.m. Cortisol  3. Further evaluation for weight gain?  4. Return in one year or earlier if problem

## 2015-10-19 NOTE — Progress Notes (Signed)
[facility] [place]  Follow-up Note  Refering Provider: Maurice Small, MD Primary Provider: Astrid Divine, MD  Subjective:   Natalie Farmer is a 44 y.o. female who returns to the [facility] in re-evaluation of the following:  HPI Comments:  Lively returns to this clinic on 10/19/2015 in reevaluation of her chronic urticaria and history of food allergy. Overall she is done very well while consistently using Zyrtec 10 mg daily and is not having significant outbreaks of her urticaria. She believes that it is related to specific food consumption. She states that if she does not drink sodas or wheeze preserve foods or processed foods that she does much better. Of concern to her is the fact that she is gaining 40 pounds of weight over the course the past year. Apparently she has had her thyroid checked which is been normal. As well, she's been having some lower back pain and what sounds like right lower extremity radiculopathy for which she seen a chiropractor.   Current Outpatient Prescriptions on File Prior to Visit  Medication Sig Dispense Refill  . docusate sodium (COLACE) 100 MG capsule Take 1 capsule (100 mg total) by mouth 2 (two) times daily. 60 capsule 2  . ibuprofen (ADVIL,MOTRIN) 600 MG tablet Take 1 tablet (600 mg total) by mouth every 6 (six) hours as needed (mild pain). 30 tablet 1  . losartan (COZAAR) 50 MG tablet TAKE 1 TABLET DAILY 90 tablet 3  . omeprazole (PRILOSEC) 20 MG capsule Take 20 mg by mouth daily.    . cyclobenzaprine (FLEXERIL) 10 MG tablet Take 10 mg by mouth daily as needed (migraines). Reported on 10/19/2015    . ferrous sulfate (FERROUSUL) 325 (65 FE) MG tablet Take 1 tablet (325 mg total) by mouth 2 (two) times daily with a meal. (Patient not taking: Reported on 10/19/2015) 100 tablet 1  . hydrochlorothiazide (MICROZIDE) 12.5 MG capsule TAKE 1 CAPSULE DAILY (Patient not taking: Reported on  10/19/2015) 90 capsule 0  . oxyCODONE-acetaminophen (PERCOCET/ROXICET) 5-325 MG per tablet Take 1-2 tablets by mouth every 4 (four) hours as needed for severe pain (moderate to severe pain (when tolerating fluids)). (Patient not taking: Reported on 10/19/2015) 30 tablet 0   No current facility-administered medications on file prior to visit.    No orders of the defined types were placed in this encounter.    Past Medical History  Diagnosis Date  . SVD (spontaneous vaginal delivery)     x 3  . PONV (postoperative nausea and vomiting)   . Headache(784.0)   . GERD (gastroesophageal reflux disease)   . Hypertension   . Anemia     Past Surgical History  Procedure Laterality Date  . Excess tissue removed  2002    from under both arms  . Wisdom tooth extraction    . Tubal ligation    . Vaginal hysterectomy N/A 09/15/2013    Procedure: HYSTERECTOMY VAGINAL;  Surgeon: Kirkland Hun, MD;  Location: [facility];  Service: Gynecology;  Laterality: N/A;  . Bilateral salpingectomy N/A 09/15/2013    Procedure: BILATERAL SALPINGECTOMY;  Surgeon: Kirkland Hun, MD;  Location: [facility];  Service: Gynecology;  Laterality: N/A;    Allergies  Allergen Reactions  . Shellfish Allergy Anaphylaxis    Review of Systems  HENT: Negative.   Eyes: Negative.   Respiratory: Negative.   Cardiovascular: Negative.   Gastrointestinal: Negative.   Musculoskeletal: Positive for back pain.  Skin: Negative.  Neurological: Negative.   Hematological: Negative.      Objective:   Filed Vitals:   10/19/15 1150  BP: 120/85  Pulse: 79  Resp: 20          Physical Exam  Constitutional: She appears well-developed and well-nourished. No distress.  HENT:  Head: Normocephalic and atraumatic. Head is without right periorbital erythema and without left periorbital erythema.  Right Ear: Tympanic membrane, external ear and ear canal normal. No drainage or tenderness. No foreign bodies. Tympanic membrane is not  injected, not scarred, not perforated, not erythematous, not retracted and not bulging. No middle ear effusion.  Left Ear: Tympanic membrane, external ear and ear canal normal. No drainage or tenderness. No foreign bodies. Tympanic membrane is not injected, not scarred, not perforated, not erythematous, not retracted and not bulging.  No middle ear effusion.  Nose: Nose normal. No mucosal edema, rhinorrhea, nose lacerations or sinus tenderness.  No foreign bodies.  Mouth/Throat: Oropharynx is clear and moist. No oropharyngeal exudate, posterior oropharyngeal edema, posterior oropharyngeal erythema or tonsillar abscesses.  Eyes: Lids are normal. Right eye exhibits no chemosis, no discharge and no exudate. No foreign body present in the right eye. Left eye exhibits no chemosis, no discharge and no exudate. No foreign body present in the left eye. Right conjunctiva is not injected. Left conjunctiva is not injected.  Neck: Neck supple. No tracheal tenderness present. No tracheal deviation and no edema present. No thyroid mass and no thyromegaly present.  Cardiovascular: Normal rate, regular rhythm, S1 normal and S2 normal.  Exam reveals no gallop.   No murmur heard. Pulmonary/Chest: No accessory muscle usage or stridor. No respiratory distress. She has no wheezes. She has no rhonchi. She has no rales.  Abdominal: Soft.  Lymphadenopathy:       Head (right side): No tonsillar adenopathy present.       Head (left side): No tonsillar adenopathy present.    She has no cervical adenopathy.  Neurological: She is alert.  Skin: Rash (Facial rosacea) noted. She is not diaphoretic.  Psychiatric: She has a normal mood and affect. Her behavior is normal.    Diagnostics: None  Assessment and Plan:   1. Idiopathic urticaria   2. History of food allergy   3. Weight gain      1. Cetirizine 10 mg daily or loratadine 20 mg daily: EpiPen if needed  2. Blood- a.m. Cortisol  3. Further evaluation for weight  gain?  4. Return in one year or earlier if problem   [name] is doing well from a overactive immune system standpoint and we'll keep her on an antihistamine on a daily basis. It is quite possible that cetirizine administration of a daily basis is contributing to some of her weight gain and I've asked her to try loratadine 20 mg daily as a substitute. As well, 40 pounds weight gain in a year's a fair amount and given the appearance of her face which looks like facial rosacea I think we need to consider the possibility that she may have a over abundance of cortisol being made in her body and we'll screen her for this by checking an a.m. cortisol. If there is some slight abnormality on her a.m. cortisol will follow up with a 24-hour urine collection for cortisol. Otherwise, I'll see her back in this clinic in 1 year or earlier if there is a problem.    Laurette SchimkeEric Kozlow, MD [facility]

## 2015-10-21 LAB — CORTISOL: Cortisol, Plasma: 4.9 ug/dL

## 2015-11-24 ENCOUNTER — Other Ambulatory Visit: Payer: Self-pay | Admitting: Allergy and Immunology

## 2016-05-22 ENCOUNTER — Other Ambulatory Visit: Payer: Self-pay | Admitting: Allergy and Immunology

## 2016-05-22 NOTE — Telephone Encounter (Signed)
Please renew this medication. Please check an ultrasound and so that medication refills do not come to my in box.  

## 2016-07-25 ENCOUNTER — Other Ambulatory Visit: Payer: Self-pay | Admitting: Allergy and Immunology

## 2016-08-16 ENCOUNTER — Other Ambulatory Visit (HOSPITAL_BASED_OUTPATIENT_CLINIC_OR_DEPARTMENT_OTHER): Payer: Self-pay | Admitting: Family Medicine

## 2016-08-16 ENCOUNTER — Ambulatory Visit (HOSPITAL_BASED_OUTPATIENT_CLINIC_OR_DEPARTMENT_OTHER)
Admission: RE | Admit: 2016-08-16 | Discharge: 2016-08-16 | Disposition: A | Discharge status: Home / Self Care | Payer: BLUE CROSS/BLUE SHIELD | Source: Ambulatory Visit | Attending: Family Medicine | Admitting: Family Medicine

## 2016-08-16 DIAGNOSIS — E049 Nontoxic goiter, unspecified: Secondary | ICD-10-CM

## 2016-08-16 DIAGNOSIS — E010 Iodine-deficiency related diffuse (endemic) goiter: Secondary | ICD-10-CM

## 2016-08-21 ENCOUNTER — Ambulatory Visit (INDEPENDENT_AMBULATORY_CARE_PROVIDER_SITE_OTHER): Payer: BLUE CROSS/BLUE SHIELD | Admitting: Podiatry

## 2016-08-21 ENCOUNTER — Encounter: Payer: Self-pay | Admitting: Podiatry

## 2016-08-21 VITALS — BP 141/92 | HR 72 | Ht 68.0 in | Wt 256.0 lb

## 2016-08-21 DIAGNOSIS — M216X2 Other acquired deformities of left foot: Secondary | ICD-10-CM | POA: Diagnosis not present

## 2016-08-21 DIAGNOSIS — M216X1 Other acquired deformities of right foot: Secondary | ICD-10-CM | POA: Diagnosis not present

## 2016-08-21 DIAGNOSIS — M79671 Pain in right foot: Secondary | ICD-10-CM | POA: Diagnosis not present

## 2016-08-21 DIAGNOSIS — M79672 Pain in left foot: Secondary | ICD-10-CM

## 2016-08-21 DIAGNOSIS — M722 Plantar fascial fibromatosis: Secondary | ICD-10-CM | POA: Diagnosis not present

## 2016-08-21 DIAGNOSIS — M21969 Unspecified acquired deformity of unspecified lower leg: Secondary | ICD-10-CM

## 2016-08-21 NOTE — Progress Notes (Signed)
SUBJECTIVE: 45 y.o. year old female presents complaining of pain in both feet for duration of 25 years. While she was in 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 Now she works in [employer] and on feet x 8 hours / day for the past 6 years. Pain is entire plantar surface with more intense pain on bottom of heel. Hurts the most at the end of the day and some discomfort and stiffness in the morning. Gets a lot of muscle cramps.  She recalls a fall turned her foot and ankle on right about 30 years ago while she was in [employer].    REVIEW OF SYSTEMS: Pertinent items noted in HPI and remainder of comprehensive ROS otherwise negative.  OBJECTIVE: DERMATOLOGIC EXAMINATION: Painful ingrown toe nails on both great toes without infection.  VASCULAR EXAMINATION OF LOWER LIMBS: All pedal pulses are palpable with normal pulsation.  Capillary Filling times within 3 seconds in all digits.  No edema or erythema noted. Temperature gradient from tibial crest to dorsum of foot is within normal bilateral.  NEUROLOGIC EXAMINATION OF THE LOWER LIMBS: All epicritic and tactile sensations grossly intact.  MUSCULOSKELETAL EXAMINATION: Positive for tight Achilles tendon right, hypermobile first ray bilateral, elevated first ray bilateral, excess subtalar joint pronation with weight bearing bilateral.   RADIOGRAPHIC STUDIES:  AP View:  Short first metatarsal, enlarged medial eminence of the first metatarsal head bilateral, increased first IM angle, fibular sesamoid position at 4 left, 5 right, increased lateral deviation angle of calcaneocuboid angle bilateral.  Lateral view:   Pronated foot with midtarsal sagging bilateral.  Plantar and posterior calcaneal spur. Elevated first metatarsal bone bilateral.  ASSESSMENT: Plantar fasciitis bilateral R>L.  Ankle Equinus right. Hypermobile first ray bilateral. STJ hyperpronation and forefoot varus  bilateral.  PLAN: Reviewed clinical findings and available treatment options, proper shoe gear, orthotics, stretch exercise, surgical options, etc. All nails debrided. Metatarsal binder dispensed with instruction. Return for custom orthotic fabrication.

## 2016-08-21 NOTE — Patient Instructions (Addendum)
Seen for painful feet. Noted of tight Achilles tendon right. Need to do daily stretch exercise as discussed. Noted of faulty biomechanics with weak first ray bilateral. Discussed custom orthotics, proper shoe gear, and possible surgical options. Metatarsal binder dispensed for daily use. Return for custom orthotic fabrication.

## 2016-08-28 ENCOUNTER — Encounter: Payer: Self-pay | Admitting: Podiatry

## 2016-08-28 ENCOUNTER — Ambulatory Visit (INDEPENDENT_AMBULATORY_CARE_PROVIDER_SITE_OTHER): Payer: BLUE CROSS/BLUE SHIELD | Admitting: Podiatry

## 2016-08-28 DIAGNOSIS — M21969 Unspecified acquired deformity of unspecified lower leg: Secondary | ICD-10-CM | POA: Diagnosis not present

## 2016-08-28 DIAGNOSIS — M216X1 Other acquired deformities of right foot: Secondary | ICD-10-CM

## 2016-08-28 DIAGNOSIS — M79671 Pain in right foot: Secondary | ICD-10-CM | POA: Diagnosis not present

## 2016-08-28 DIAGNOSIS — M79672 Pain in left foot: Secondary | ICD-10-CM | POA: Diagnosis not present

## 2016-08-28 DIAGNOSIS — M722 Plantar fascial fibromatosis: Secondary | ICD-10-CM | POA: Diagnosis not present

## 2016-08-28 DIAGNOSIS — M216X2 Other acquired deformities of left foot: Secondary | ICD-10-CM

## 2016-08-28 NOTE — Progress Notes (Signed)
SUBJECTIVE: 45 y.o. year old female presents to have orthotics prepared. Been using Metatarsal binder. Cannot tell if they are helping.  Most pain now is at the center right heel, but both heels hurt. Patient also request for cortisone injection to have some immediate relief.  She was not able to do daily stretch exercise as instructed.  HPI:  Pain in both feet for duration of 25 years. While she was in 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 Now she works in [employer] and on feet x 8 hours / day for the past 6 years. Pain is entire plantar surface with more intense pain on bottom of heel. Hurts the most at the end of the day and some discomfort and stiffness in the morning. Gets a lot of muscle cramps.  She recalls a fall turned her foot and ankle on right about 30 years ago while she was in [employer].   OBJECTIVE: DERMATOLOGIC EXAMINATION: No abnormal findings.  Neuro Vascular status are within normal.   MUSCULOSKELETAL EXAMINATION: Positive for tight Achilles tendon right, hypermobile first ray bilateral, elevated first ray bilateral, excess subtalar joint pronation with weight bearing bilateral.   Previous radiographic examinination include AP View:  Short first metatarsal, enlarged medial eminence of the first metatarsal head bilateral, increased first IM angle, fibular sesamoid position at 4 left, 5 right, increased lateral deviation angle of calcaneocuboid angle bilateral.  Lateral view:   Pronated foot with midtarsal sagging bilateral.  Plantar and posterior calcaneal spur. Elevated first metatarsal bone bilateral.  ASSESSMENT: Plantar fasciitis bilateral R>L.  Ankle Equinus right. Hypermobile first ray bilateral. STJ hyperpronation and forefoot varus bilateral.  PLAN: Both feet casted for Orthotics. As per request both heel injected with mixture of 4 mg Dexamethasone, 4 mg Triamcinolone, and 1 cc of 0.5% Marcaine plain.  Patient tolerated well without difficulty.  Will contact patient when orthotics are ready.

## 2016-08-28 NOTE — Patient Instructions (Signed)
Both feet casted for orthotics and cortisone injection given. Will call when orthotics are ready.

## 2016-10-09 ENCOUNTER — Encounter: Attending: Family Medicine | Admitting: Skilled Nursing Facility1

## 2016-10-09 ENCOUNTER — Encounter: Payer: Self-pay | Admitting: Skilled Nursing Facility1

## 2016-10-09 DIAGNOSIS — E119 Type 2 diabetes mellitus without complications: Secondary | ICD-10-CM | POA: Diagnosis not present

## 2016-10-09 DIAGNOSIS — Z713 Dietary counseling and surveillance: Secondary | ICD-10-CM | POA: Insufficient documentation

## 2016-10-09 NOTE — Progress Notes (Signed)
Patient was seen on 10/09/2016 for the first of a series of three diabetes self-management courses at [facility].  Patient Education Plan per assessed needs and concerns is to attend four course education program for Diabetes Self Management Education.  The following learning objectives were met by the patient during this class:  Describe diabetes  State some common risk factors for diabetes  Defines the role of glucose and insulin  Identifies type of diabetes and pathophysiology  Describe the relationship between diabetes and cardiovascular risk  State the members of the Healthcare Team  States the rationale for glucose monitoring  State when to test glucose  State their individual Target Range  State the importance of logging glucose readings  Describe how to interpret glucose readings  Identifies A1C target  Explain the correlation between A1c and eAG values  State symptoms and treatment of high blood glucose  State symptoms and treatment of low blood glucose  Explain proper technique for glucose testing  Identifies proper sharps disposal  Handouts given during class include:  Living Well with Diabetes book  Carb Counting and Meal Planning book  Meal Plan Card  Carbohydrate guide  Meal planning worksheet  Low Sodium Flavoring Tips  The diabetes portion plate  J5H to eAG Conversion Chart  Diabetes Medications  Diabetes Recommended Care Schedule  Support Group  Diabetes Success Plan  Core Class Satisfaction Survey  Follow-Up Plan:  Attend core 2  Patient was seen on 10/09/2016 for the second of a series of three diabetes self-management courses at the [facility]. The following learning objectives were met by the patient during this class:   Describe the role of different macronutrients on glucose  Explain how carbohydrates affect blood glucose  State what foods contain the  most carbohydrates  Demonstrate carbohydrate counting  Demonstrate how to read Nutrition Facts food label  Describe effects of various fats on heart health  Describe the importance of good nutrition for health and healthy eating strategies  Describe techniques for managing your shopping, cooking and meal planning  List strategies to follow meal plan when dining out  Describe the effects of alcohol on glucose and how to use it safely  Goals:  Follow Diabetes Meal Plan as instructed  Eat 3 meals and 2 snacks, every 3-5 hrs  Limit carbohydrate intake to 45 grams carbohydrate/meal Limit carbohydrate intake to 15 grams carbohydrate/snack Add lean protein foods to meals/snacks  Monitor glucose levels as instructed by your doctor   Follow-Up Plan:  Attend Core 3  Work towards following your personal food plan.

## 2016-10-16 ENCOUNTER — Encounter: Admitting: Skilled Nursing Facility1

## 2016-10-16 ENCOUNTER — Encounter: Payer: Self-pay | Admitting: Skilled Nursing Facility1

## 2016-10-16 DIAGNOSIS — E119 Type 2 diabetes mellitus without complications: Secondary | ICD-10-CM

## 2016-10-16 DIAGNOSIS — Z713 Dietary counseling and surveillance: Secondary | ICD-10-CM | POA: Diagnosis not present

## 2016-10-16 NOTE — Progress Notes (Signed)
Patient was seen on 10/16/2016 for the third of a series of three diabetes self-management courses at the [facility]. The following learning objectives were met by the patient during this class:  . State the amount of activity recommended for healthy living . Describe activities suitable for individual needs . Identify ways to regularly incorporate activity into daily life . Identify barriers to activity and ways to over come these barriers  Identify diabetes medications being personally used and their primary action for lowering glucose and possible side effects . Describe role of stress on blood glucose and develop strategies to address psychosocial issues . Identify diabetes complications and ways to prevent them  Explain how to manage diabetes during illness . Evaluate success in meeting personal goal . Establish 2-3 goals that they will plan to diligently work on until they return for the  1-monthfollow-up visit  Goals:   I will count my carb choices at most meals and snacks  Meal plan  I will be active 30 minutes or more 5 times a week  I will test my glucose at least 2 times a day, 7 days a week  Your patient has identified their diabetes self-care support plan as  On-line Resources Plan:  Attend Monthly Diabetes Support Group as needed or make a future follow up appointment

## 2016-10-24 ENCOUNTER — Other Ambulatory Visit: Payer: Self-pay | Admitting: Allergy and Immunology

## 2016-12-18 ENCOUNTER — Other Ambulatory Visit: Payer: Self-pay | Admitting: Allergy and Immunology

## 2016-12-18 ENCOUNTER — Encounter: Payer: Self-pay | Admitting: Allergy and Immunology

## 2016-12-18 ENCOUNTER — Ambulatory Visit (INDEPENDENT_AMBULATORY_CARE_PROVIDER_SITE_OTHER): Payer: BLUE CROSS/BLUE SHIELD | Admitting: Allergy and Immunology

## 2016-12-18 VITALS — BP 120/82 | HR 80 | Resp 18

## 2016-12-18 DIAGNOSIS — Z91018 Allergy to other foods: Secondary | ICD-10-CM

## 2016-12-18 DIAGNOSIS — L719 Rosacea, unspecified: Secondary | ICD-10-CM | POA: Diagnosis not present

## 2016-12-18 DIAGNOSIS — L501 Idiopathic urticaria: Secondary | ICD-10-CM

## 2016-12-18 MED ORDER — METRONIDAZOLE 0.75 % EX CREA: TOPICAL_CREAM | CUTANEOUS | 0 refills | Status: AC

## 2016-12-18 NOTE — Patient Instructions (Addendum)
  1. Continue Cetirizine 10 mg two times per day  2. EpiPen if needed  3. Start metrocream applied to face two times per day  4. Contact clinic in two months concerning metrocream. Doxycyline?  5. Return in one year or earlier if problem

## 2016-12-18 NOTE — Progress Notes (Signed)
Follow-up Note  Referring Provider: Maurice Farmer, Elaine, Farmer Primary Provider: Astrid Farmer,Natalie Farmer COLLINS, Farmer Date of Office Visit: 12/18/2016  Subjective:   Natalie Farmer (DOB: 03/20/1971) is a 46 y.o. female who [facility] in re-evaluation of the following:  HPI: Natalie Farmer returns to this clinic in reevaluation of her chronic urticaria and history of food allergy. I've not seen her in his clinic in over one year.  Overall she thinks her urticaria is doing relatively well as long she continues on cetirizine twice a day. She does have some intermittent outbreaks of very low intensity involving her chest and around her face but no other associated systemic or constitutional symptoms. She's not required systemic steroids to treat an exacerbation of this issue.  When I last saw her in this clinic she also had an issue with weight gain apparently with normal thyroid function tests. I did obtain a screening a.m. cortisol looking for possible cortisol overproduction and that level was normal. Since that point in time she's been diagnosed with diabetes and is on metformin and glimepiride. She still continues to have issues with abnormal weight and she is considering undergoing a significant lifestyle change with increased exercise and changed diet.  She did obtain the flu vaccine this year.  Allergies as of 12/18/2016      Reactions   Shellfish Allergy Anaphylaxis   Ginger       Medication List      EPINEPHrine 0.3 mg/0.3 mL Soaj injection Commonly known as:  EPI-PEN   ferrous sulfate 325 (65 FE) MG tablet Commonly known as:  FERROUSUL Take 1 tablet (325 mg total) by mouth 2 (two) times daily with a meal.   glimepiride 2 MG tablet Commonly known as:  AMARYL Take 2 mg by mouth daily with breakfast.   hydrochlorothiazide 12.5 MG capsule Commonly known as:  MICROZIDE TAKE 1 CAPSULE DAILY   losartan 50 MG tablet Commonly known as:  COZAAR TAKE 1  TABLET DAILY   metFORMIN 500 MG tablet Commonly known as:  GLUCOPHAGE Take 500 mg by mouth 2 (two) times daily with a meal.   omeprazole 20 MG capsule Commonly known as:  PRILOSEC Take 20 mg by mouth daily.       Past Medical History:  Diagnosis Date  . Anemia   . Diabetes mellitus without complication (HCC)   . GERD (gastroesophageal reflux disease)   . Headache(784.0)   . Hypertension   . PONV (postoperative nausea and vomiting)   . SVD (spontaneous vaginal delivery)    x 3    Past Surgical History:  Procedure Laterality Date  . BILATERAL SALPINGECTOMY N/A 09/15/2013   Procedure: BILATERAL SALPINGECTOMY;  Surgeon: Natalie HunArthur Stringer, Farmer;  Location: [facility];  Service: Gynecology;  Laterality: N/A;  . excess tissue removed  2002   from under both arms  . TUBAL LIGATION    . VAGINAL HYSTERECTOMY N/A 09/15/2013   Procedure: HYSTERECTOMY VAGINAL;  Surgeon: Natalie HunArthur Stringer, Farmer;  Location: [facility];  Service: Gynecology;  Laterality: N/A;  . WISDOM TOOTH EXTRACTION      Review of systems negative except as noted in HPI / PMHx or noted below:  Review of Systems  Constitutional: Negative.   HENT: Negative.   Eyes: Negative.   Respiratory: Negative.   Cardiovascular: Negative.   Gastrointestinal: Negative.   Genitourinary: Negative.   Musculoskeletal: Negative.   Skin: Negative.   Neurological: Negative.   Endo/Heme/Allergies: Negative.   Psychiatric/Behavioral: Negative.  Objective:   Vitals:   12/18/16 1102  BP: 120/82  Pulse: 80  Resp: 18          Physical Exam  Constitutional: She is well-developed, well-nourished, and in no distress.  HENT:  Head: Normocephalic.  Right Ear: Tympanic membrane, external ear and ear canal normal.  Left Ear: Tympanic membrane, external ear and ear canal normal.  Nose: Nose normal. No mucosal edema or rhinorrhea.  Mouth/Throat: Uvula is midline, oropharynx is clear and moist and mucous membranes are normal. No  oropharyngeal exudate.  Eyes: Conjunctivae are normal.  Neck: Trachea normal. No tracheal tenderness present. No tracheal deviation present. No thyromegaly present.  Cardiovascular: Normal rate, regular rhythm, S1 normal, S2 normal and normal heart sounds.   No murmur heard. Pulmonary/Chest: Breath sounds normal. No stridor. No respiratory distress. She has no wheezes. She has no rales.  Musculoskeletal: She exhibits no edema.  Lymphadenopathy:       Head (right side): No tonsillar adenopathy present.       Head (left side): No tonsillar adenopathy present.    She has no cervical adenopathy.  Neurological: She is alert. Gait normal.  Skin: Rash (slight facial erythema and induration involving forehead, nasal and malar area with extension down to mouth.) noted. She is not diaphoretic. No erythema. Nails show no clubbing.  Psychiatric: Mood and affect normal.    Diagnostics: Results of a.m. cortisol obtained on 10/19/2015 identified a level of 4.9 g/DL   Assessment and Plan:   1. Idiopathic urticaria   2. History of food allergy   3. Rosacea    1. Continue Cetirizine 10 mg two times per day  2. EpiPen if needed  3. Start metrocream applied to face two times per day  4. Contact clinic in two months concerning metrocream. Doxycyline?  5. Return in one year or earlier if problem   Natalie Farmer will utilize a antihistamine on a regular basis in an attempt to prevent her from developing significant problems with her urticaria. If she fails this double dose antihistamine she would be a candidate for omalizumab  At this point in time she is very satisfied with the response that she's receives with this treatment. She does appear to have rosacea and we'll start her on MetroCream I've asked her to contact me in 2 months with his response. She does not have an adequate response we can give her some doxycycline as well.  Natalie Schimke, Farmer [facility]

## 2017-05-24 ENCOUNTER — Other Ambulatory Visit: Payer: Self-pay | Admitting: Allergy and Immunology

## 2017-05-28 ENCOUNTER — Other Ambulatory Visit: Payer: Self-pay

## 2017-05-28 MED ORDER — HYDROCHLOROTHIAZIDE 12.5 MG PO CAPS: ORAL_CAPSULE | ORAL | 1 refills | Status: AC

## 2018-06-29 ENCOUNTER — Other Ambulatory Visit: Payer: Self-pay | Admitting: Allergy and Immunology

## 2018-08-24 IMAGING — US US THYROID
1 series · 13 of 25 positions shown · non-contrast
Comparison: None.

CLINICAL DATA: Palpable abnormality. Thyromegaly on physical
examination.

EXAM:
THYROID ULTRASOUND
TECHNIQUE: Ultrasound examination of the thyroid gland and adjacent soft
tissues was performed.

[Series 1: us thyroid · 0.06mm/px · 13 of 29 slices shown]
[im 1/29]
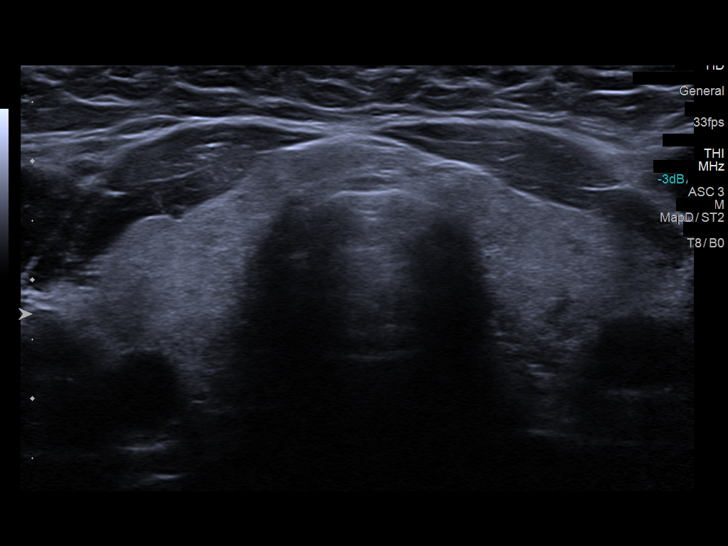
[im 3/29]
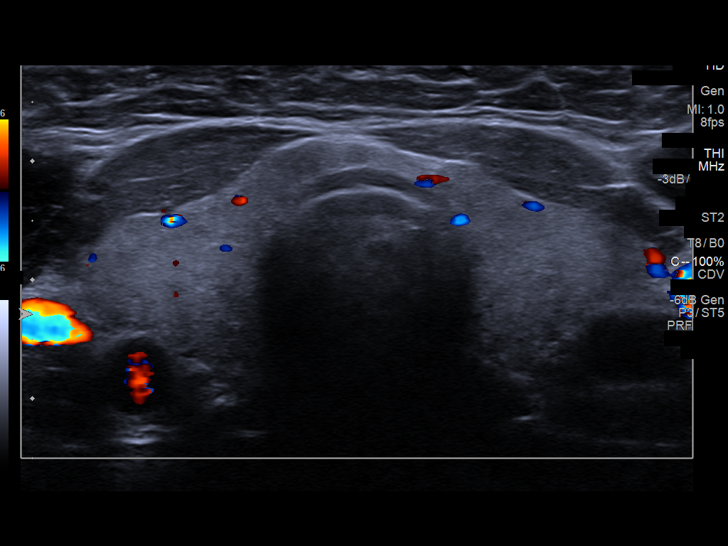
[im 5/29]
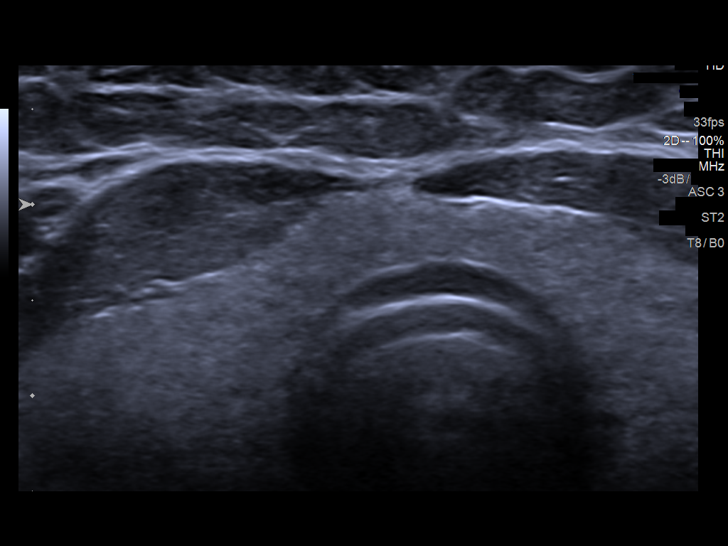
[im 8/29]
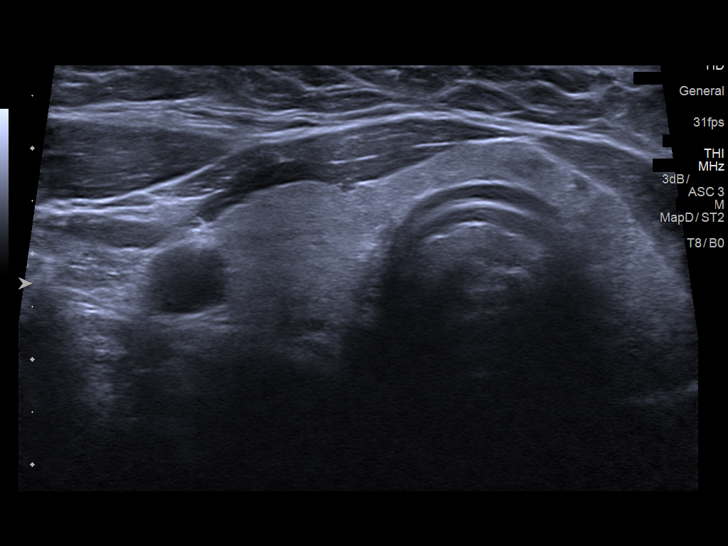
[im 10/29]
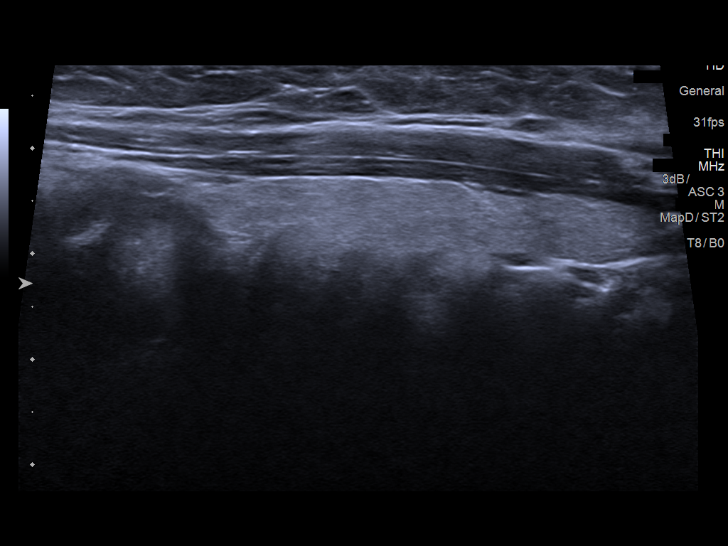
[im 12/29]
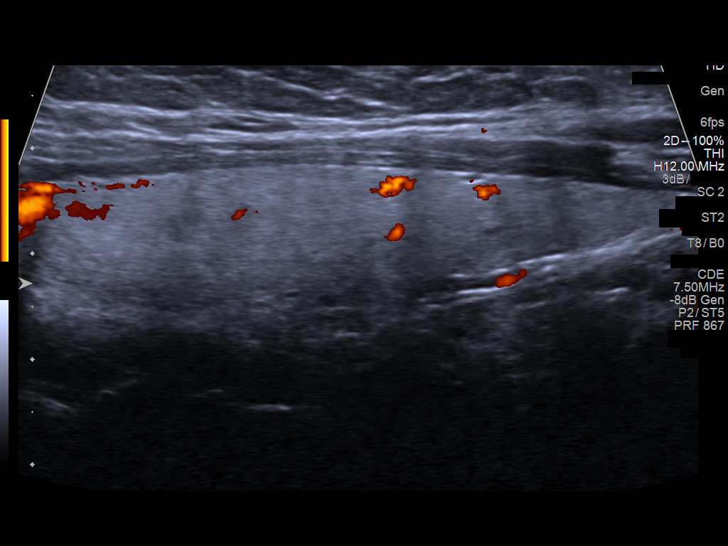
[im 15/29]
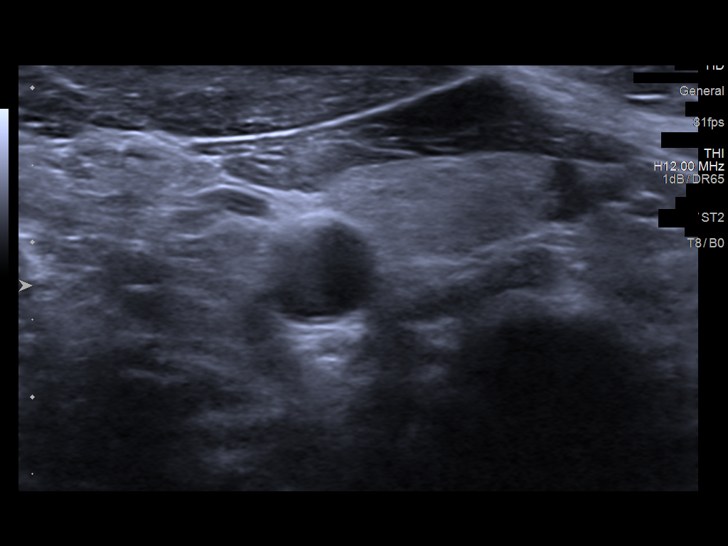
[im 17/29]
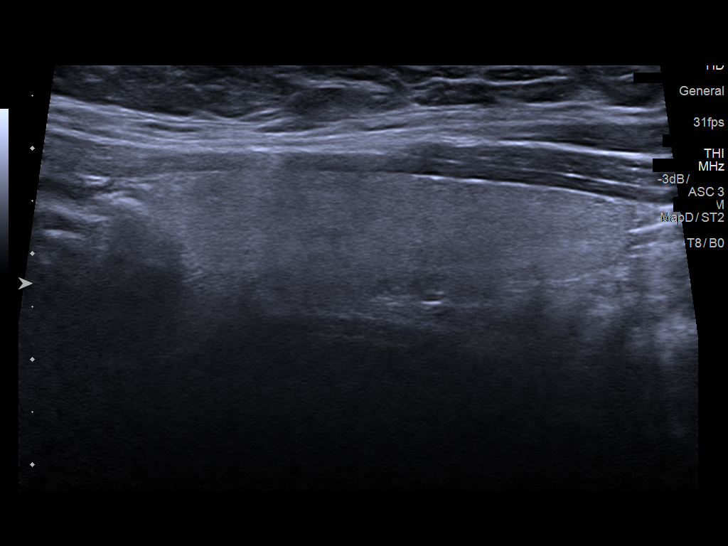
[im 19/29]
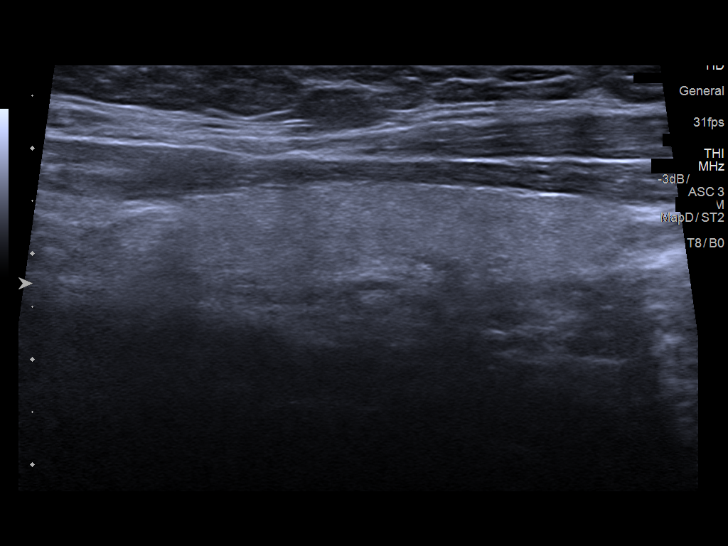
[im 22/29]
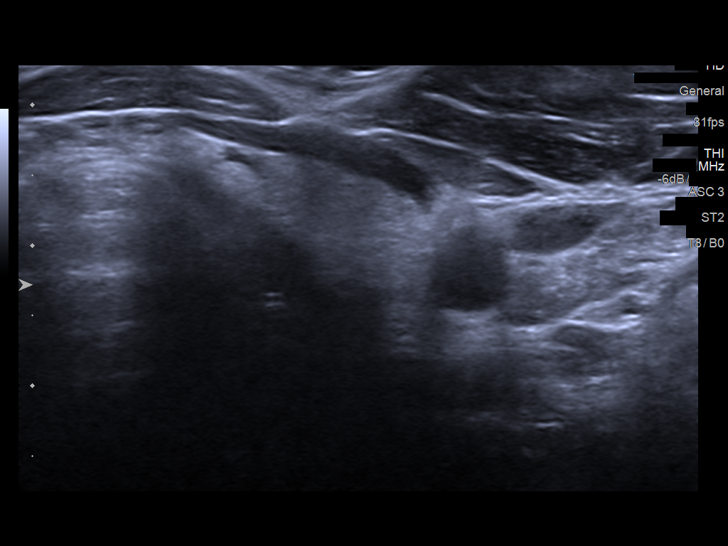
[im 24/29]
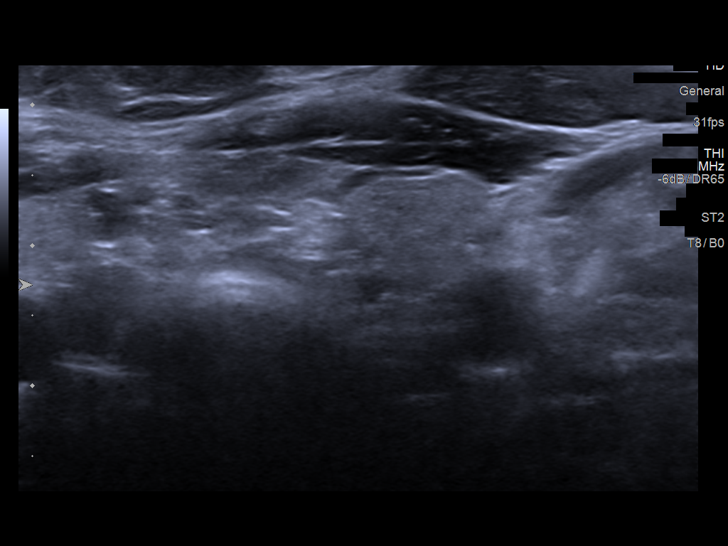
[im 26/29]
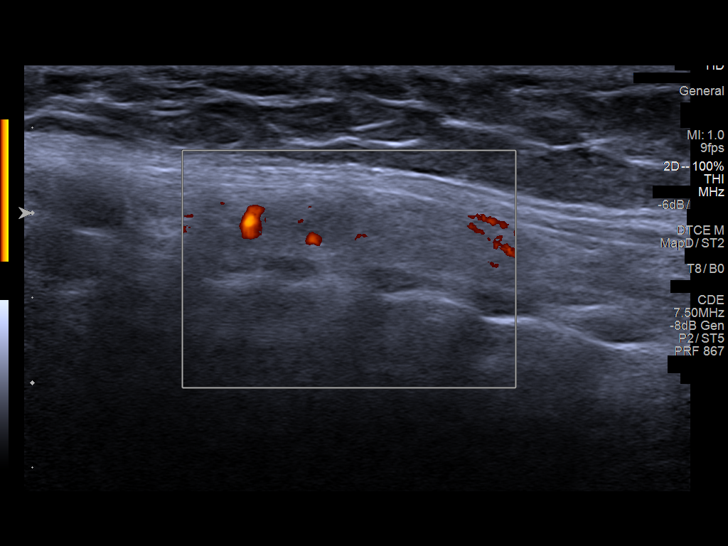
[im 29/29]
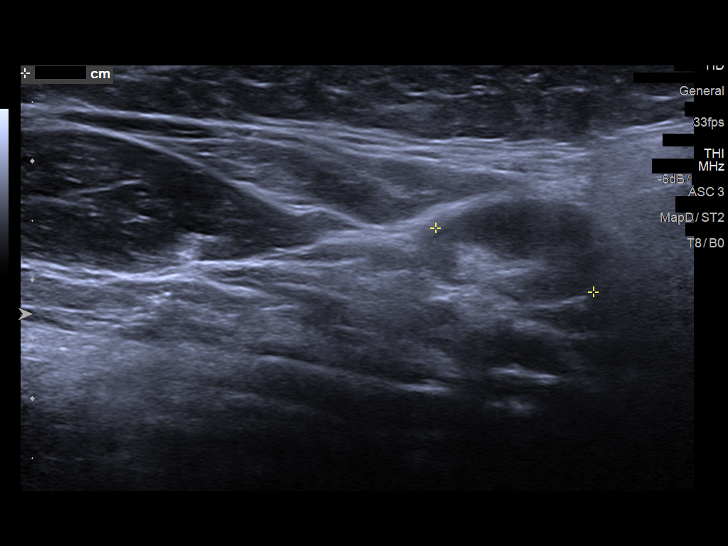

[13 of 25 positions shown; findings below may reference images not displayed]

FINDINGS: Parenchymal Echotexture: Normal

Estimated total number of nodules >/= 1 cm: 0

Number of spongiform nodules >/=  2 cm not described below (TR1): 0

Number of mixed cystic and solid nodules >/= 1.5 cm not described
below (TR2): 0

_________________________________________________________

Isthmus: 0.4 cm.

There is questionable hypoechoic nodule along the right side of the
isthmus that measures up to 0.4 cm. This may represent a small
pseudo nodule. This nodule does not meet criteria for biopsy or a
dedicated follow-up.

_________________________________________________________

Right lobe: Measures 6.0 x 1.5 x 1.7 cm.

No discrete nodules are identified within the right lobe of the
thyroid.

_________________________________________________________

Left lobe: Measures 4.6 x 1.3 x1.5 cm

No discrete nodules are identified within the left lobe of the
thyroid.

There is a prominent lymph node on the left side of neck measuring
to 0.9 cm in the short axis and 1.4 cm in transverse dimension.
There are additional small lymph nodes on the left side of the neck.
IMPRESSION: Thyroid exam is essentially normal. There is a questionable tiny
nodule in the isthmus.

Prominent left cervical lymph node is nonspecific. There are
additional small lymph nodes in this area. Correlate with physical
exam findings. If further evaluation is needed, recommend a
post-contrast neck CT.

The above is in keeping with the ACR TI-RADS recommendations - [HOSPITAL] 0710;[DATE].

## 2018-12-08 DIAGNOSIS — E1169 Type 2 diabetes mellitus with other specified complication: Secondary | ICD-10-CM | POA: Diagnosis not present

## 2019-02-22 DIAGNOSIS — R300 Dysuria: Secondary | ICD-10-CM | POA: Diagnosis not present

## 2019-02-22 DIAGNOSIS — N390 Urinary tract infection, site not specified: Secondary | ICD-10-CM | POA: Diagnosis not present

## 2019-02-22 DIAGNOSIS — R319 Hematuria, unspecified: Secondary | ICD-10-CM | POA: Diagnosis not present

## 2019-03-02 DIAGNOSIS — R61 Generalized hyperhidrosis: Secondary | ICD-10-CM | POA: Diagnosis not present

## 2019-03-02 DIAGNOSIS — Z1231 Encounter for screening mammogram for malignant neoplasm of breast: Secondary | ICD-10-CM | POA: Diagnosis not present

## 2019-03-02 DIAGNOSIS — Z113 Encounter for screening for infections with a predominantly sexual mode of transmission: Secondary | ICD-10-CM | POA: Diagnosis not present

## 2019-03-02 DIAGNOSIS — Z6834 Body mass index (BMI) 34.0-34.9, adult: Secondary | ICD-10-CM | POA: Diagnosis not present

## 2019-03-02 DIAGNOSIS — Z01411 Encounter for gynecological examination (general) (routine) with abnormal findings: Secondary | ICD-10-CM | POA: Diagnosis not present

## 2019-03-05 DIAGNOSIS — N959 Unspecified menopausal and perimenopausal disorder: Secondary | ICD-10-CM | POA: Diagnosis not present

## 2019-03-05 DIAGNOSIS — E1169 Type 2 diabetes mellitus with other specified complication: Secondary | ICD-10-CM | POA: Diagnosis not present

## 2019-03-05 DIAGNOSIS — F418 Other specified anxiety disorders: Secondary | ICD-10-CM | POA: Diagnosis not present

## 2019-03-18 DIAGNOSIS — B002 Herpesviral gingivostomatitis and pharyngotonsillitis: Secondary | ICD-10-CM | POA: Diagnosis not present

## 2019-03-18 DIAGNOSIS — B009 Herpesviral infection, unspecified: Secondary | ICD-10-CM | POA: Diagnosis not present

## 2019-03-18 DIAGNOSIS — A609 Anogenital herpesviral infection, unspecified: Secondary | ICD-10-CM | POA: Diagnosis not present

## 2019-04-25 ENCOUNTER — Encounter (HOSPITAL_BASED_OUTPATIENT_CLINIC_OR_DEPARTMENT_OTHER): Payer: Self-pay | Admitting: Adult Health

## 2019-04-25 ENCOUNTER — Emergency Department (HOSPITAL_BASED_OUTPATIENT_CLINIC_OR_DEPARTMENT_OTHER): Payer: BLUE CROSS/BLUE SHIELD

## 2019-04-25 ENCOUNTER — Other Ambulatory Visit: Payer: Self-pay

## 2019-04-25 ENCOUNTER — Emergency Department (HOSPITAL_BASED_OUTPATIENT_CLINIC_OR_DEPARTMENT_OTHER)
Admission: EM | Admit: 2019-04-25 | Discharge: 2019-04-25 | Disposition: A | Discharge status: Home / Self Care | Payer: BLUE CROSS/BLUE SHIELD | Attending: Emergency Medicine | Admitting: Emergency Medicine

## 2019-04-25 DIAGNOSIS — S92511A Displaced fracture of proximal phalanx of right lesser toe(s), initial encounter for closed fracture: Secondary | ICD-10-CM | POA: Insufficient documentation

## 2019-04-25 DIAGNOSIS — Z79899 Other long term (current) drug therapy: Secondary | ICD-10-CM | POA: Insufficient documentation

## 2019-04-25 DIAGNOSIS — E119 Type 2 diabetes mellitus without complications: Secondary | ICD-10-CM | POA: Diagnosis not present

## 2019-04-25 DIAGNOSIS — Y929 Unspecified place or not applicable: Secondary | ICD-10-CM | POA: Diagnosis not present

## 2019-04-25 DIAGNOSIS — I10 Essential (primary) hypertension: Secondary | ICD-10-CM | POA: Insufficient documentation

## 2019-04-25 DIAGNOSIS — Z7984 Long term (current) use of oral hypoglycemic drugs: Secondary | ICD-10-CM | POA: Diagnosis not present

## 2019-04-25 DIAGNOSIS — S99921A Unspecified injury of right foot, initial encounter: Secondary | ICD-10-CM | POA: Diagnosis not present

## 2019-04-25 DIAGNOSIS — Y999 Unspecified external cause status: Secondary | ICD-10-CM | POA: Insufficient documentation

## 2019-04-25 DIAGNOSIS — W2203XA Walked into furniture, initial encounter: Secondary | ICD-10-CM | POA: Insufficient documentation

## 2019-04-25 DIAGNOSIS — Y9301 Activity, walking, marching and hiking: Secondary | ICD-10-CM | POA: Insufficient documentation

## 2019-04-25 DIAGNOSIS — S92514A Nondisplaced fracture of proximal phalanx of right lesser toe(s), initial encounter for closed fracture: Secondary | ICD-10-CM | POA: Diagnosis not present

## 2019-04-25 NOTE — Discharge Instructions (Signed)
It is important that you buddy tape your toes for the next 6-8 weeks.  Wear the hard soled shoe as needed for your comfort, however make sure to always wear supportive shoe like a sneaker if unable to wear the hard soled shoe.  Use ice 3-4 times daily alternating 20 minutes on, 20 minutes off.  Elevate your leg whenever you are not walking on it.  You can take ibuprofen or Tylenol as prescribed over-the-counter, as needed for your pain.  Please follow-up with your doctor you develop any new or worsening symptoms.

## 2019-04-25 NOTE — ED Provider Notes (Signed)
[facility] EMERGENCY DEPARTMENT Provider Note   CSN: [id number] Arrival date & time: 04/25/19  1035    History   Chief Complaint Chief Complaint  Patient presents with  . Toe Injury    HPI Natalie Farmer is a 48 y.o. female with history of hypertension, diabetes, GERD who presents with right fourth toe pain and bruising after hitting it on a bed frame.  Patient reports this happened last evening.  She is used ice at home.  She has been able to walk on it, but she was unable to fit in her sneaker due to the swelling.  The bruising has gotten worse overnight.  She has not taken any medications at home for her pain.  She is mostly concerned about going back to work and not being able to wear a shoe.     HPI  Past Medical History:  Diagnosis Date  . Anemia   . Diabetes mellitus without complication (HCC)   . GERD (gastroesophageal reflux disease)   . Headache(784.0)   . Hypertension   . PONV (postoperative nausea and vomiting)   . SVD (spontaneous vaginal delivery)    x 3    Patient Active Problem List   Diagnosis Date Noted  . Foot pain, bilateral 08/21/2016  . Idiopathic urticaria 10/19/2015  . History of food allergy 10/19/2015  . Weight gain 10/19/2015    Past Surgical History:  Procedure Laterality Date  . BILATERAL SALPINGECTOMY N/A 09/15/2013   Procedure: BILATERAL SALPINGECTOMY;  Surgeon: Kirkland HunArthur Stringer, MD;  Location: [facility];  Service: Gynecology;  Laterality: N/A;  . excess tissue removed  2002   from under both arms  . TUBAL LIGATION    . VAGINAL HYSTERECTOMY N/A 09/15/2013   Procedure: HYSTERECTOMY VAGINAL;  Surgeon: Kirkland HunArthur Stringer, MD;  Location: [facility];  Service: Gynecology;  Laterality: N/A;  . WISDOM TOOTH EXTRACTION       OB History    Gravida  3   Para  3   Term  3   Preterm      AB      Living  3     SAB      TAB      Ectopic      Multiple      Live Births               Home Medications    Prior to  Admission medications   Medication Sig Start Date End Date Taking? Authorizing Provider  EPINEPHrine 0.3 mg/0.3 mL IJ SOAJ injection  08/16/16   [provider]  ferrous sulfate (FERROUSUL) 325 (65 FE) MG tablet Take 1 tablet (325 mg total) by mouth 2 (two) times daily with a meal. 09/16/13   Kirkland HunStringer, Arthur, MD  glimepiride (AMARYL) 2 MG tablet Take 2 mg by mouth daily with breakfast.    [provider]  hydrochlorothiazide (MICROZIDE) 12.5 MG capsule Take one capsule once daily as directed 05/28/17   Kozlow, Alvira PhilipsEric J, MD  losartan (COZAAR) 50 MG tablet TAKE 1 TABLET DAILY 07/25/16   Kozlow, Alvira PhilipsEric J, MD  metFORMIN (GLUCOPHAGE) 500 MG tablet Take 500 mg by mouth 2 (two) times daily with a meal.    [provider]  metroNIDAZOLE (METROCREAM) 0.75 % cream Apply to face as directed twice daily 12/18/16   Kozlow, Alvira PhilipsEric J, MD  omeprazole (PRILOSEC) 20 MG capsule Take 20 mg by mouth daily.    [provider]    Family History History reviewed. No  pertinent family history.  Social History Social History   Tobacco Use  . Smoking status: Never Smoker  . Smokeless tobacco: Never Used  Substance Use Topics  . Alcohol use: No  . Drug use: No     Allergies   Shellfish allergy and Ginger   Review of Systems Review of Systems  Musculoskeletal: Positive for arthralgias and joint swelling.  Skin: Positive for color change. Negative for wound.     Physical Exam Updated Vital Signs BP (!) 134/93 (BP Location: Right Arm)   Pulse 87   Temp 98.7 F (37.1 C) (Oral)   Resp 18   Ht 5\' 8"  (1.727 m)   Wt 99.8 kg   LMP 07/11/2012   SpO2 100%   BMI 33.45 kg/m   Physical Exam Vitals signs and nursing note reviewed.  Constitutional:      General: She is not in acute distress.    Appearance: She is well-developed. She is not diaphoretic.  HENT:     Head: Normocephalic and atraumatic.     Mouth/Throat:     Pharynx: No oropharyngeal exudate.  Eyes:      General: No scleral icterus.       Right eye: No discharge.        Left eye: No discharge.     Conjunctiva/sclera: Conjunctivae normal.     Pupils: Pupils are equal, round, and reactive to light.  Neck:     Musculoskeletal: Normal range of motion and neck supple.     Thyroid: No thyromegaly.  Cardiovascular:     Rate and Rhythm: Normal rate and regular rhythm.     Heart sounds: Normal heart sounds. No murmur. No friction rub. No gallop.   Pulmonary:     Effort: Pulmonary effort is normal. No respiratory distress.     Breath sounds: Normal breath sounds. No stridor. No wheezing or rales.  Musculoskeletal:     Comments: Tenderness and ecchymosis to the right fourth toe with ecchymosis extending to the knee metatarsophalangeal joint of the fifth; there is very subtle ecchymosis on the right shin without significant tenderness, only soreness; full range of motion of the ankle and toes; sensation intact; DP pulses intact; no break in skin  Lymphadenopathy:     Cervical: No cervical adenopathy.  Skin:    General: Skin is warm and dry.     Coloration: Skin is not pale.     Findings: No rash.  Neurological:     Mental Status: She is alert.     Coordination: Coordination normal.      ED Treatments / Results  Labs (all labs ordered are listed, but only abnormal results are displayed) Labs Reviewed - No data to display  EKG None  Radiology Dg Foot Complete Right  Result Date: 04/25/2019 CLINICAL DATA:  RIGHT foot injury EXAM: RIGHT FOOT COMPLETE - 3+ VIEW COMPARISON:  None. FINDINGS: Minimally displaced obliquely oriented fracture of the fourth proximal phalanx. No other fracture identified. No joint space dislocation. Soft tissues about the RIGHT foot are unremarkable. Incidental note made of mild spurring at the plantar aspects of the posterior calcaneus. IMPRESSION: Minimally displaced obliquely oriented fracture of the fourth proximal phalanx. Electronically Signed   By: Bary RichardStan   Maynard M.D.   On: 04/25/2019 11:51    Procedures Procedures (including critical care time)  Medications Ordered in ED Medications - No data to display   Initial Impression / Assessment and Plan / ED Course  I have reviewed the triage vital signs and  the nursing notes.  Pertinent labs & imaging results that were available during my care of the patient were reviewed by me and considered in my medical decision making (see chart for details).        Patient presenting with right fourth toe pain after hitting her foot on the bed frame.  X-ray shows minimally displaced obliquely oriented fracture of the fourth proximal phalanx.  This corresponds with exam.  Patient is neurovascularly intact.  Patient will be buddy taped and provided a postop shoe.  Elevation, ice, ibuprofen, Tylenol recommended.  Follow-up to PCP as needed.  Patient vies to wear buddy taping for at least 6 to 8 weeks.  Return precautions discussed.  Patient understands and agrees with plan.  Patient vitals stable and discharged in satisfactory condition.  Final Clinical Impressions(s) / ED Diagnoses   Final diagnoses:  Closed displaced fracture of proximal phalanx of lesser toe of right foot, initial encounter    ED Discharge Orders    None       Frederica Kuster, PA-C 04/25/19 [place], Wenda Overland, MD 04/25/19 1429

## 2019-04-25 NOTE — ED Triage Notes (Signed)
PResent with bruising and pain to right 5th and 4th toes. She hit her foot on a bed frame

## 2019-04-29 DIAGNOSIS — K219 Gastro-esophageal reflux disease without esophagitis: Secondary | ICD-10-CM | POA: Insufficient documentation

## 2019-04-29 DIAGNOSIS — R61 Generalized hyperhidrosis: Secondary | ICD-10-CM | POA: Diagnosis not present

## 2019-04-29 DIAGNOSIS — D649 Anemia, unspecified: Secondary | ICD-10-CM | POA: Insufficient documentation

## 2019-04-29 DIAGNOSIS — E119 Type 2 diabetes mellitus without complications: Secondary | ICD-10-CM | POA: Insufficient documentation

## 2019-04-29 DIAGNOSIS — I10 Essential (primary) hypertension: Secondary | ICD-10-CM | POA: Insufficient documentation

## 2019-06-23 DIAGNOSIS — G4733 Obstructive sleep apnea (adult) (pediatric): Secondary | ICD-10-CM | POA: Diagnosis not present

## 2019-06-23 DIAGNOSIS — E1169 Type 2 diabetes mellitus with other specified complication: Secondary | ICD-10-CM | POA: Diagnosis not present

## 2019-07-27 DIAGNOSIS — M9905 Segmental and somatic dysfunction of pelvic region: Secondary | ICD-10-CM | POA: Diagnosis not present

## 2019-07-27 DIAGNOSIS — M9903 Segmental and somatic dysfunction of lumbar region: Secondary | ICD-10-CM | POA: Diagnosis not present

## 2019-07-27 DIAGNOSIS — M9904 Segmental and somatic dysfunction of sacral region: Secondary | ICD-10-CM | POA: Diagnosis not present

## 2019-07-27 DIAGNOSIS — M5416 Radiculopathy, lumbar region: Secondary | ICD-10-CM | POA: Diagnosis not present

## 2019-08-10 DIAGNOSIS — M5416 Radiculopathy, lumbar region: Secondary | ICD-10-CM | POA: Diagnosis not present

## 2019-08-10 DIAGNOSIS — M9903 Segmental and somatic dysfunction of lumbar region: Secondary | ICD-10-CM | POA: Diagnosis not present

## 2019-08-10 DIAGNOSIS — M9904 Segmental and somatic dysfunction of sacral region: Secondary | ICD-10-CM | POA: Diagnosis not present

## 2019-08-10 DIAGNOSIS — M9905 Segmental and somatic dysfunction of pelvic region: Secondary | ICD-10-CM | POA: Diagnosis not present

## 2019-08-12 DIAGNOSIS — M9903 Segmental and somatic dysfunction of lumbar region: Secondary | ICD-10-CM | POA: Diagnosis not present

## 2019-08-12 DIAGNOSIS — M9904 Segmental and somatic dysfunction of sacral region: Secondary | ICD-10-CM | POA: Diagnosis not present

## 2019-08-12 DIAGNOSIS — M9905 Segmental and somatic dysfunction of pelvic region: Secondary | ICD-10-CM | POA: Diagnosis not present

## 2019-08-12 DIAGNOSIS — M5416 Radiculopathy, lumbar region: Secondary | ICD-10-CM | POA: Diagnosis not present

## 2019-08-19 DIAGNOSIS — M5416 Radiculopathy, lumbar region: Secondary | ICD-10-CM | POA: Diagnosis not present

## 2019-08-19 DIAGNOSIS — M9903 Segmental and somatic dysfunction of lumbar region: Secondary | ICD-10-CM | POA: Diagnosis not present

## 2019-08-19 DIAGNOSIS — M9904 Segmental and somatic dysfunction of sacral region: Secondary | ICD-10-CM | POA: Diagnosis not present

## 2019-08-19 DIAGNOSIS — M9905 Segmental and somatic dysfunction of pelvic region: Secondary | ICD-10-CM | POA: Diagnosis not present

## 2019-08-31 DIAGNOSIS — M9905 Segmental and somatic dysfunction of pelvic region: Secondary | ICD-10-CM | POA: Diagnosis not present

## 2019-08-31 DIAGNOSIS — M9903 Segmental and somatic dysfunction of lumbar region: Secondary | ICD-10-CM | POA: Diagnosis not present

## 2019-08-31 DIAGNOSIS — M9904 Segmental and somatic dysfunction of sacral region: Secondary | ICD-10-CM | POA: Diagnosis not present

## 2019-08-31 DIAGNOSIS — M5416 Radiculopathy, lumbar region: Secondary | ICD-10-CM | POA: Diagnosis not present

## 2019-09-15 DIAGNOSIS — Z0000 Encounter for general adult medical examination without abnormal findings: Secondary | ICD-10-CM | POA: Diagnosis not present

## 2019-09-15 DIAGNOSIS — E1169 Type 2 diabetes mellitus with other specified complication: Secondary | ICD-10-CM | POA: Diagnosis not present

## 2019-09-15 DIAGNOSIS — I10 Essential (primary) hypertension: Secondary | ICD-10-CM | POA: Diagnosis not present

## 2019-09-15 DIAGNOSIS — Z8349 Family history of other endocrine, nutritional and metabolic diseases: Secondary | ICD-10-CM | POA: Diagnosis not present

## 2019-09-15 DIAGNOSIS — F418 Other specified anxiety disorders: Secondary | ICD-10-CM | POA: Diagnosis not present

## 2019-09-15 DIAGNOSIS — G4733 Obstructive sleep apnea (adult) (pediatric): Secondary | ICD-10-CM | POA: Diagnosis not present

## 2019-09-15 DIAGNOSIS — Z23 Encounter for immunization: Secondary | ICD-10-CM | POA: Diagnosis not present

## 2019-09-28 DIAGNOSIS — M9905 Segmental and somatic dysfunction of pelvic region: Secondary | ICD-10-CM | POA: Diagnosis not present

## 2019-09-28 DIAGNOSIS — M9903 Segmental and somatic dysfunction of lumbar region: Secondary | ICD-10-CM | POA: Diagnosis not present

## 2019-09-28 DIAGNOSIS — M9904 Segmental and somatic dysfunction of sacral region: Secondary | ICD-10-CM | POA: Diagnosis not present

## 2019-09-28 DIAGNOSIS — M5416 Radiculopathy, lumbar region: Secondary | ICD-10-CM | POA: Diagnosis not present

## 2020-03-15 DIAGNOSIS — E1169 Type 2 diabetes mellitus with other specified complication: Secondary | ICD-10-CM | POA: Diagnosis not present

## 2020-03-15 DIAGNOSIS — E669 Obesity, unspecified: Secondary | ICD-10-CM | POA: Diagnosis not present

## 2020-03-15 DIAGNOSIS — F329 Major depressive disorder, single episode, unspecified: Secondary | ICD-10-CM | POA: Diagnosis not present

## 2020-03-15 DIAGNOSIS — I10 Essential (primary) hypertension: Secondary | ICD-10-CM | POA: Diagnosis not present

## 2020-05-24 IMAGING — DX RIGHT FOOT COMPLETE - 3+ VIEW
5 series · 5 of 5 positions shown · non-contrast
Comparison: None.

CLINICAL DATA: RIGHT foot injury

EXAM:
RIGHT FOOT COMPLETE - 3+ VIEW

[foot ap]
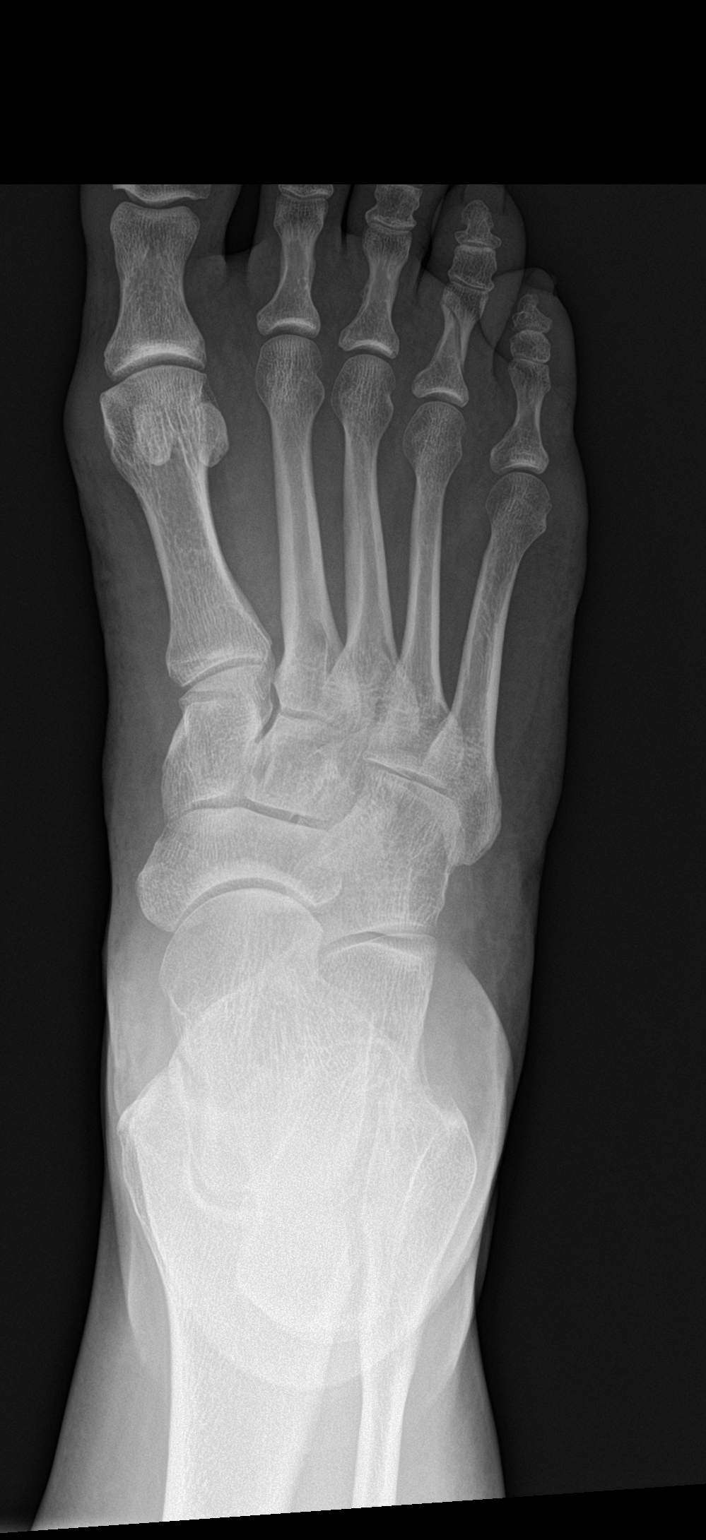

[foot obl]
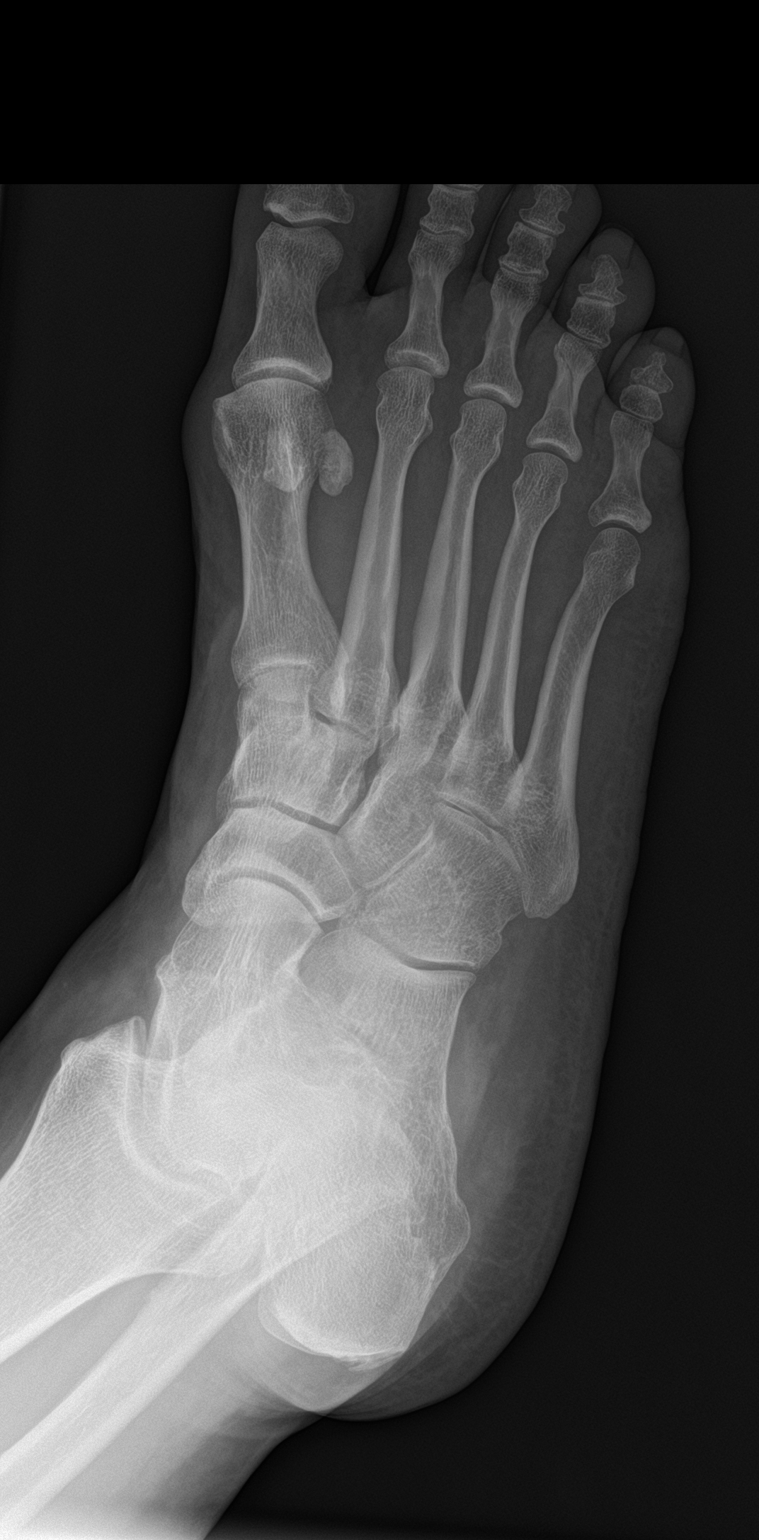

[foot lat (1 of 3)]
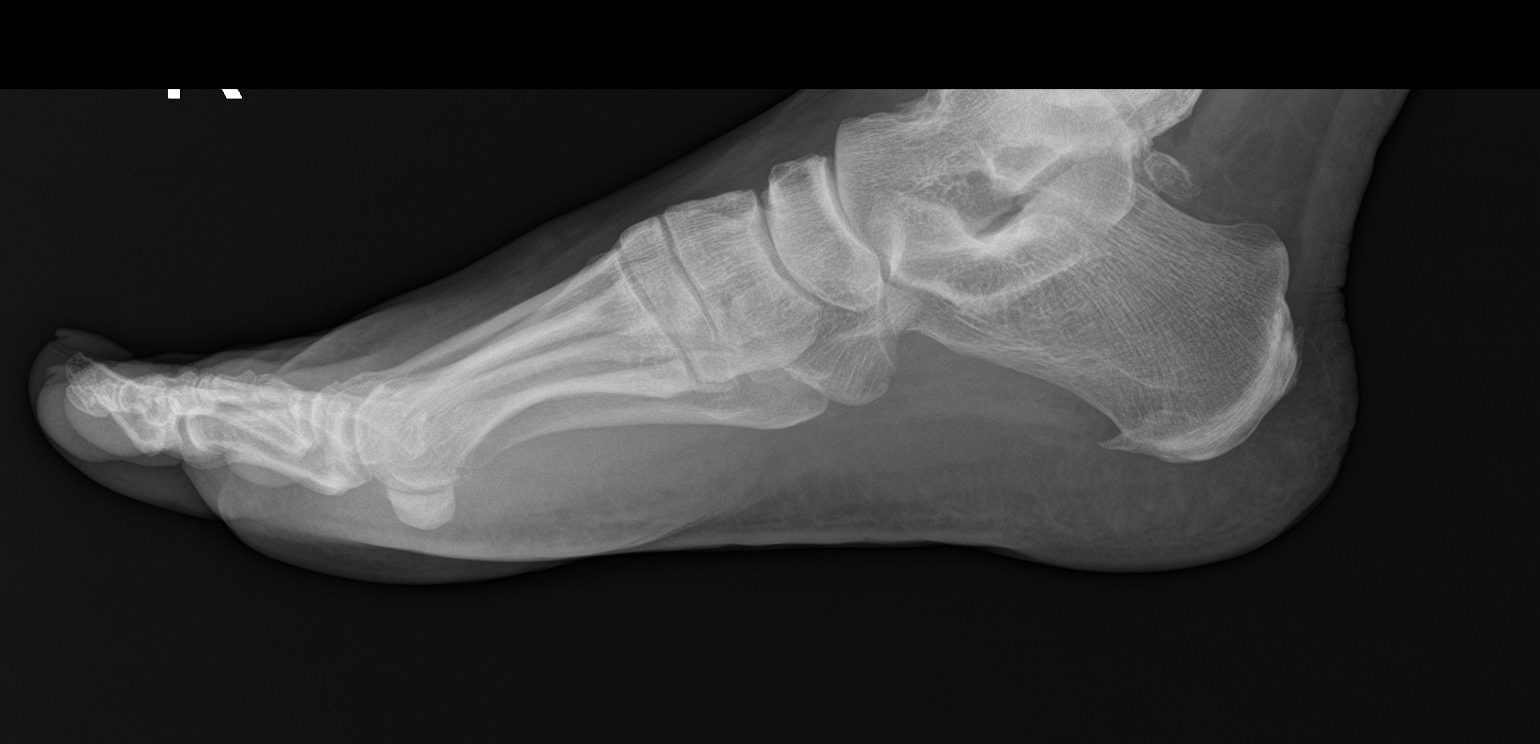

[foot lat (2 of 3)]
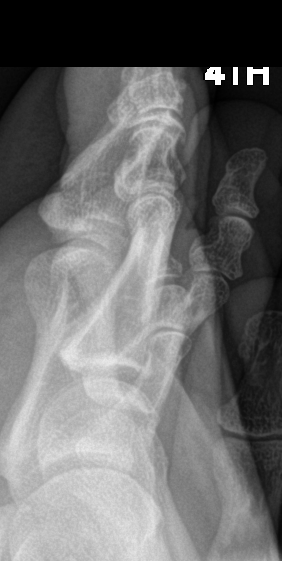

[foot lat (3 of 3)]
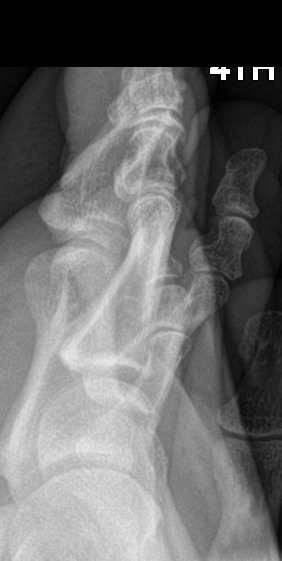

[5 of 5 positions shown; findings below may reference images not displayed]

FINDINGS: Minimally displaced obliquely oriented fracture of the fourth
proximal phalanx. No other fracture identified. No joint space
dislocation. Soft tissues about the RIGHT foot are unremarkable.
Incidental note made of mild spurring at the plantar aspects of the
posterior calcaneus.
IMPRESSION: Minimally displaced obliquely oriented fracture of the fourth
proximal phalanx.

## 2020-06-14 DIAGNOSIS — G4733 Obstructive sleep apnea (adult) (pediatric): Secondary | ICD-10-CM | POA: Diagnosis not present

## 2020-06-16 ENCOUNTER — Ambulatory Visit: Payer: BC Managed Care – PPO | Admitting: Podiatry

## 2020-06-16 ENCOUNTER — Other Ambulatory Visit: Payer: Self-pay

## 2020-06-16 ENCOUNTER — Ambulatory Visit (INDEPENDENT_AMBULATORY_CARE_PROVIDER_SITE_OTHER): Payer: BC Managed Care – PPO

## 2020-06-16 ENCOUNTER — Encounter: Payer: Self-pay | Admitting: Podiatry

## 2020-06-16 VITALS — Temp 97.3°F

## 2020-06-16 DIAGNOSIS — M722 Plantar fascial fibromatosis: Secondary | ICD-10-CM

## 2020-06-16 DIAGNOSIS — N920 Excessive and frequent menstruation with regular cycle: Secondary | ICD-10-CM | POA: Insufficient documentation

## 2020-06-16 DIAGNOSIS — E669 Obesity, unspecified: Secondary | ICD-10-CM | POA: Insufficient documentation

## 2020-06-16 DIAGNOSIS — M779 Enthesopathy, unspecified: Secondary | ICD-10-CM

## 2020-06-16 DIAGNOSIS — M79671 Pain in right foot: Secondary | ICD-10-CM

## 2020-06-16 DIAGNOSIS — E1169 Type 2 diabetes mellitus with other specified complication: Secondary | ICD-10-CM | POA: Diagnosis not present

## 2020-06-16 DIAGNOSIS — M79672 Pain in left foot: Secondary | ICD-10-CM | POA: Diagnosis not present

## 2020-06-16 MED ORDER — DICLOFENAC SODIUM 75 MG PO TBEC: 75.0000 mg | DELAYED_RELEASE_TABLET | Freq: Two times a day (BID) | ORAL | 2 refills | Status: AC

## 2020-06-16 NOTE — Progress Notes (Signed)
Subjective:   Patient ID: Natalie Farmer, female   DOB: 49 y.o.   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  Also complains about the right ankle hurting more than the left and knows that she needs orthotics.  Patient does not smoke likes to be active   Review of Systems  All other systems reviewed and are negative.       Objective:  Physical Exam Vitals and nursing note reviewed.  Constitutional:      Appearance: She is well-developed.  Pulmonary:     Effort: Pulmonary effort is normal.  Musculoskeletal:        General: Normal range of motion.  Skin:    General: Skin is warm.  Neurological:     Mental Status: She is alert.     Neurovascular status intact muscle strength found to be adequate range of motion within normal limits.  Patient is found to have exquisite discomfort in the plantar aspect of the right heel at the insertional point tendon calcaneus moderate pain left and the right subtalar joint is very inflamed and sore.  Patient has good digital perfusion well oriented x3     Assessment:  Acute plantar fasciitis right with inflammation fluid buildup along with sinus tarsitis right and fasciitis left     Plan:  H&P reviewed condition.  I would like to be able to make orthotics so we can control her experience and we will talk about that at next visit and today I did sterile prep injected the plantar fascial right 3 mg Kenalog 5 mg Xylocaine and injected the sinus tarsi right 3 mg Kenalog 5 mg Xylocaine.  I applied fascial brace gave instructions for physical therapy placed on diclofenac 75 mg twice daily and reappoint to recheck in several weeks  X-rays indicate that there is small spurs no indication stress fracture arthritis

## 2020-06-16 NOTE — Patient Instructions (Signed)
Diabetes Mellitus and Foot Care Foot care is an important part of your health, especially when you have diabetes. Diabetes may cause you to have problems because of poor blood flow (circulation) to your feet and legs, which can cause your skin to:  Become thinner and drier.  Break more easily.  Heal more slowly.  Peel and crack. You may also have nerve damage (neuropathy) in your legs and feet, causing decreased feeling in them. This means that you may not notice minor injuries to your feet that could lead to more serious problems. Noticing and addressing any potential problems early is the best way to prevent future foot problems. How to care for your feet Foot hygiene  Wash your feet daily with warm water and mild soap. Do not use hot water. Then, pat your feet and the areas between your toes until they are completely dry. Do not soak your feet as this can dry your skin.  Trim your toenails straight across. Do not dig under them or around the cuticle. File the edges of your nails with an emery board or nail file.  Apply a moisturizing lotion or petroleum jelly to the skin on your feet and to dry, brittle toenails. Use lotion that does not contain alcohol and is unscented. Do not apply lotion between your toes. Shoes and socks  Wear clean socks or stockings every day. Make sure they are not too tight. Do not wear knee-high stockings since they may decrease blood flow to your legs.  Wear shoes that fit properly and have enough cushioning. Always look in your shoes before you put them on to be sure there are no objects inside.  To break in new shoes, wear them for just a few hours a day. This prevents injuries on your feet. Wounds, scrapes, corns, and calluses  Check your feet daily for blisters, cuts, bruises, sores, and redness. If you cannot see the bottom of your feet, use a mirror or ask someone for help.  Do not cut corns or calluses or try to remove them with medicine.  If you  find a minor scrape, cut, or break in the skin on your feet, keep it and the skin around it clean and dry. You may clean these areas with mild soap and water. Do not clean the area with peroxide, alcohol, or iodine.  If you have a wound, scrape, corn, or callus on your foot, look at it several times a day to make sure it is healing and not infected. Check for: ? Redness, swelling, or pain. ? Fluid or blood. ? Warmth. ? Pus or a bad smell. General instructions  Do not cross your legs. This may decrease blood flow to your feet.  Do not use heating pads or hot water bottles on your feet. They may burn your skin. If you have lost feeling in your feet or legs, you may not know this is happening until it is too late.  Protect your feet from hot and cold by wearing shoes, such as at the beach or on hot pavement.  Schedule a complete foot exam at least once a year (annually) or more often if you have foot problems. If you have foot problems, report any cuts, sores, or bruises to your health care provider immediately. Contact a health care provider if:  You have a medical condition that increases your risk of infection and you have any cuts, sores, or bruises on your feet.  You have an injury that is not   healing.  You have redness on your legs or feet.  You feel burning or tingling in your legs or feet.  You have pain or cramps in your legs and feet.  Your legs or feet are numb.  Your feet always feel cold.  You have pain around a toenail. Get help right away if:  You have a wound, scrape, corn, or callus on your foot and: ? You have pain, swelling, or redness that gets worse. ? You have fluid or blood coming from the wound, scrape, corn, or callus. ? Your wound, scrape, corn, or callus feels warm to the touch. ? You have pus or a bad smell coming from the wound, scrape, corn, or callus. ? You have a fever. ? You have a red line going up your leg. Summary  Check your feet every day  for cuts, sores, red spots, swelling, and blisters.  Moisturize feet and legs daily.  Wear shoes that fit properly and have enough cushioning.  If you have foot problems, report any cuts, sores, or bruises to your health care provider immediately.  Schedule a complete foot exam at least once a year (annually) or more often if you have foot problems. This information is not intended to replace advice given to you by your health care provider. Make sure you discuss any questions you have with your health care provider. Document Revised: 07/08/2019 Document Reviewed: 11/16/2016 Elsevier Patient Education  2020 [employer]  Bunion  A bunion is a bump on the base of the big toe that forms when the bones of the big toe joint move out of position. Bunions may be small at first, but they often get larger over time. They can make walking painful. What are the causes? A bunion may be caused by:  Wearing narrow or pointed shoes that force the big toe to press against the other toes.  Abnormal foot development that causes the foot to roll inward (pronate).  Changes in the foot that are caused by certain diseases, such as rheumatoid arthritis or polio.  A foot injury. What increases the risk? The following factors may make you more likely to develop this condition:  Wearing shoes that squeeze the toes together.  Having certain diseases, such as: ? Rheumatoid arthritis. ? Polio. ? Cerebral palsy.  Having family members who have bunions.  Being born with a foot deformity, such as flat feet or low arches.  Doing activities that put a lot of pressure on the feet, such as ballet dancing. What are the signs or symptoms? The main symptom of a bunion is a noticeable bump on the big toe. Other symptoms may include:  Pain.  Swelling around the big toe.  Redness and inflammation.  Thick or hardened skin on the big toe or between the toes.  Stiffness or loss of motion in the big  toe.  Trouble with walking. How is this diagnosed? A bunion may be diagnosed based on your symptoms, medical history, and activities. You may have tests, such as:  X-rays. These allow your health care provider to check the position of the bones in your foot and look for damage to your joint. They also help your health care provider determine the severity of your bunion and the best way to treat it.  Joint aspiration. In this test, a sample of fluid is removed from the toe joint. This test may be done if you are in a lot of pain. It helps rule out diseases that cause painful   swelling of the joints, such as arthritis. How is this treated? Treatment depends on the severity of your symptoms. The goal of treatment is to relieve symptoms and prevent the bunion from getting worse. Your health care provider may recommend:  Wearing shoes that have a wide toe box.  Using bunion pads to cushion the affected area.  Taping your toes together to keep them in a normal position.  Placing a device inside your shoe (orthotics) to help reduce pressure on your toe joint.  Taking medicine to ease pain, inflammation, and swelling.  Applying heat or ice to the affected area.  Doing stretching exercises.  Surgery to remove scar tissue and move the toes back into their normal position. This treatment is rare. Follow these instructions at home: Managing pain, stiffness, and swelling   If directed, put ice on the painful area: ? Put ice in a plastic bag. ? Place a towel between your skin and the bag. ? Leave the ice on for 20 minutes, 2-3 times a day. Activity   If directed, apply heat to the affected area before you exercise. Use the heat source that your health care provider recommends, such as a moist heat pack or a heating pad. ? Place a towel between your skin and the heat source. ? Leave the heat on for 20-30 minutes. ? Remove the heat if your skin turns bright red. This is especially important  if you are unable to feel pain, heat, or cold. You may have a greater risk of getting burned.  Do exercises as told by your health care provider. General instructions  Support your toe joint with proper footwear, shoe padding, or taping as told by your health care provider.  Take over-the-counter and prescription medicines only as told by your health care provider.  Keep all follow-up visits as told by your health care provider. This is important. Contact a health care provider if your symptoms:  Get worse.  Do not improve in 2 weeks. Get help right away if you have:  Severe pain and trouble with walking. Summary  A bunion is a bump on the base of the big toe that forms when the bones of the big toe joint move out of position.  Bunions can make walking painful.  Treatment depends on the severity of your symptoms.  Support your toe joint with proper footwear, shoe padding, or taping as told by your health care provider. This information is not intended to replace advice given to you by your health care provider. Make sure you discuss any questions you have with your health care provider. Document Revised: 04/21/2018 Document Reviewed: 02/25/2018 Elsevier Patient Education  2020 [employer]  

## 2020-06-24 ENCOUNTER — Other Ambulatory Visit: Payer: Self-pay | Admitting: Podiatry

## 2020-06-24 DIAGNOSIS — M779 Enthesopathy, unspecified: Secondary | ICD-10-CM

## 2020-06-24 DIAGNOSIS — M722 Plantar fascial fibromatosis: Secondary | ICD-10-CM

## 2020-06-30 ENCOUNTER — Ambulatory Visit: Payer: BC Managed Care – PPO | Admitting: Podiatry

## 2020-11-02 DIAGNOSIS — Z8349 Family history of other endocrine, nutritional and metabolic diseases: Secondary | ICD-10-CM | POA: Diagnosis not present

## 2020-11-02 DIAGNOSIS — E1169 Type 2 diabetes mellitus with other specified complication: Secondary | ICD-10-CM | POA: Diagnosis not present

## 2020-11-02 DIAGNOSIS — Z23 Encounter for immunization: Secondary | ICD-10-CM | POA: Diagnosis not present

## 2020-11-02 DIAGNOSIS — Z0000 Encounter for general adult medical examination without abnormal findings: Secondary | ICD-10-CM | POA: Diagnosis not present

## 2020-11-02 DIAGNOSIS — I10 Essential (primary) hypertension: Secondary | ICD-10-CM | POA: Diagnosis not present

## 2020-11-02 DIAGNOSIS — N959 Unspecified menopausal and perimenopausal disorder: Secondary | ICD-10-CM | POA: Diagnosis not present

## 2020-11-07 DIAGNOSIS — Z20828 Contact with and (suspected) exposure to other viral communicable diseases: Secondary | ICD-10-CM | POA: Diagnosis not present

## 2020-11-07 DIAGNOSIS — J029 Acute pharyngitis, unspecified: Secondary | ICD-10-CM | POA: Diagnosis not present

## 2020-11-07 DIAGNOSIS — R509 Fever, unspecified: Secondary | ICD-10-CM | POA: Diagnosis not present

## 2020-11-08 DIAGNOSIS — J029 Acute pharyngitis, unspecified: Secondary | ICD-10-CM | POA: Diagnosis not present

## 2020-12-21 DIAGNOSIS — Z20822 Contact with and (suspected) exposure to covid-19: Secondary | ICD-10-CM | POA: Diagnosis not present

## 2020-12-21 DIAGNOSIS — U071 COVID-19: Secondary | ICD-10-CM | POA: Diagnosis not present

## 2021-01-11 DIAGNOSIS — G4733 Obstructive sleep apnea (adult) (pediatric): Secondary | ICD-10-CM | POA: Diagnosis not present

## 2021-03-15 DIAGNOSIS — Z20822 Contact with and (suspected) exposure to covid-19: Secondary | ICD-10-CM | POA: Diagnosis not present
